# Patient Record
Sex: Male | Born: 1955
Health system: Southern US, Community
[De-identification: ages and names within clinical notes are randomized; demographics above are authoritative.]

## PROBLEM LIST (undated history)

## (undated) DIAGNOSIS — R030 Elevated blood-pressure reading, without diagnosis of hypertension: Secondary | ICD-10-CM

## (undated) DIAGNOSIS — F32A Depression, unspecified: Secondary | ICD-10-CM

## (undated) DIAGNOSIS — E559 Vitamin D deficiency, unspecified: Secondary | ICD-10-CM

## (undated) DIAGNOSIS — Z9109 Other allergy status, other than to drugs and biological substances: Secondary | ICD-10-CM

## (undated) DIAGNOSIS — F419 Anxiety disorder, unspecified: Secondary | ICD-10-CM

## (undated) DIAGNOSIS — I10 Essential (primary) hypertension: Secondary | ICD-10-CM

## (undated) DIAGNOSIS — F329 Major depressive disorder, single episode, unspecified: Secondary | ICD-10-CM

## (undated) DIAGNOSIS — H9190 Unspecified hearing loss, unspecified ear: Secondary | ICD-10-CM

## (undated) DIAGNOSIS — E66811 Obesity, class 1: Secondary | ICD-10-CM

## (undated) DIAGNOSIS — N200 Calculus of kidney: Secondary | ICD-10-CM

## (undated) DIAGNOSIS — K219 Gastro-esophageal reflux disease without esophagitis: Secondary | ICD-10-CM

## (undated) DIAGNOSIS — E7800 Pure hypercholesterolemia, unspecified: Secondary | ICD-10-CM

## (undated) DIAGNOSIS — J189 Pneumonia, unspecified organism: Secondary | ICD-10-CM

## (undated) DIAGNOSIS — E669 Obesity, unspecified: Secondary | ICD-10-CM

## (undated) DIAGNOSIS — J342 Deviated nasal septum: Secondary | ICD-10-CM

## (undated) DIAGNOSIS — K760 Fatty (change of) liver, not elsewhere classified: Secondary | ICD-10-CM

## (undated) DIAGNOSIS — D126 Benign neoplasm of colon, unspecified: Secondary | ICD-10-CM

## (undated) DIAGNOSIS — B029 Zoster without complications: Secondary | ICD-10-CM

## (undated) HISTORY — DX: Zoster without complications: B02.9

## (undated) HISTORY — DX: Essential (primary) hypertension: I10

## (undated) HISTORY — DX: Obesity, unspecified: E66.9

## (undated) HISTORY — DX: Calculus of kidney: N20.0

## (undated) HISTORY — PX: POLYPECTOMY: SHX149

## (undated) HISTORY — DX: Other allergy status, other than to drugs and biological substances: Z91.09

## (undated) HISTORY — DX: Gilbert syndrome: E80.4

## (undated) HISTORY — DX: Benign neoplasm of colon, unspecified: D12.6

## (undated) HISTORY — DX: Obesity, class 1: E66.811

## (undated) HISTORY — DX: Vitamin D deficiency, unspecified: E55.9

## (undated) HISTORY — DX: Fatty (change of) liver, not elsewhere classified: K76.0

## (undated) HISTORY — DX: Unspecified hearing loss, unspecified ear: H91.90

## (undated) HISTORY — DX: Deviated nasal septum: J34.2

## (undated) HISTORY — PX: OTHER SURGICAL HISTORY: SHX169

## (undated) HISTORY — DX: Elevated blood-pressure reading, without diagnosis of hypertension: R03.0

## (undated) HISTORY — DX: Pure hypercholesterolemia, unspecified: E78.00

---
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
## 2005-11-05 ENCOUNTER — Ambulatory Visit (HOSPITAL_COMMUNITY): Admission: RE | Admit: 2005-11-05 | Discharge: 2005-11-05 | Payer: Self-pay | Admitting: Internal Medicine

## 2006-11-05 ENCOUNTER — Ambulatory Visit (HOSPITAL_COMMUNITY): Admission: RE | Admit: 2006-11-05 | Discharge: 2006-11-05 | Payer: Self-pay | Admitting: Internal Medicine

## 2007-05-07 ENCOUNTER — Ambulatory Visit: Payer: Self-pay | Admitting: Internal Medicine

## 2008-08-06 ENCOUNTER — Ambulatory Visit: Payer: Self-pay | Admitting: Internal Medicine

## 2008-08-18 ENCOUNTER — Ambulatory Visit: Payer: Self-pay | Admitting: Internal Medicine

## 2008-08-18 ENCOUNTER — Encounter: Payer: Self-pay | Admitting: Internal Medicine

## 2008-08-19 ENCOUNTER — Encounter: Payer: Self-pay | Admitting: Internal Medicine

## 2009-12-17 DIAGNOSIS — B029 Zoster without complications: Secondary | ICD-10-CM

## 2009-12-17 HISTORY — DX: Zoster without complications: B02.9

## 2011-08-29 ENCOUNTER — Encounter: Payer: Self-pay | Admitting: Internal Medicine

## 2012-01-17 ENCOUNTER — Encounter: Payer: Self-pay | Admitting: Internal Medicine

## 2012-01-21 ENCOUNTER — Encounter: Payer: Self-pay | Admitting: Internal Medicine

## 2012-01-21 ENCOUNTER — Ambulatory Visit (AMBULATORY_SURGERY_CENTER): Payer: BC Managed Care – PPO

## 2012-01-21 VITALS — Ht 69.5 in | Wt 225.4 lb

## 2012-01-21 DIAGNOSIS — Z1211 Encounter for screening for malignant neoplasm of colon: Secondary | ICD-10-CM

## 2012-01-21 MED ORDER — PEG-KCL-NACL-NASULF-NA ASC-C 100 G PO SOLR: 1.0000 | Freq: Once | ORAL | Status: DC

## 2012-02-01 ENCOUNTER — Ambulatory Visit (AMBULATORY_SURGERY_CENTER): Payer: BC Managed Care – PPO | Admitting: Internal Medicine

## 2012-02-01 ENCOUNTER — Encounter: Payer: Self-pay | Admitting: Internal Medicine

## 2012-02-01 VITALS — BP 149/93 | HR 69 | Temp 97.2°F | Resp 12 | Ht 69.0 in | Wt 225.0 lb

## 2012-02-01 DIAGNOSIS — K5730 Diverticulosis of large intestine without perforation or abscess without bleeding: Secondary | ICD-10-CM

## 2012-02-01 DIAGNOSIS — Z1211 Encounter for screening for malignant neoplasm of colon: Secondary | ICD-10-CM

## 2012-02-01 DIAGNOSIS — Z86010 Personal history of colonic polyps: Secondary | ICD-10-CM

## 2012-02-01 HISTORY — PX: COLONOSCOPY: SHX174

## 2012-02-01 MED ORDER — SODIUM CHLORIDE 0.9 % IV SOLN: 500.0000 mL | INTRAVENOUS | Status: DC

## 2012-02-01 NOTE — Op Note (Signed)
[facility] [address] [place], [place]  [address]  COLONOSCOPY PROCEDURE REPORT  PATIENT:  Stephens, Shreve  MR#:  #[id number] BIRTHDATE:  [birth year], 56 yrs. old  GENDER:  male ENDOSCOPIST:  Wilhemina Bonito. Eda Keys, MD REF. BY:  Surveillance Program Recall, PROCEDURE DATE:  02/01/2012 PROCEDURE:  Surveillance Colonoscopy ASA CLASS:  Class II INDICATIONS:  history of pre-cancerous (adenomatous) colon polyps, surveillance and high-risk screening ; index exam 08-2008 > 3 small adenomas MEDICATIONS:   MAC sedation, administered by CRNA, propofol (Diprivan) 250 mg IV  DESCRIPTION OF PROCEDURE:   After the risks benefits and alternatives of the procedure were thoroughly explained, informed consent was obtained.  Digital rectal exam was performed and revealed no abnormalities.   The LB CF-[id number] endoscope was introduced through the anus and advanced to the cecum, which was identified by both the appendix and ileocecal valve, without limitations.  The quality of the prep was good, using MoviPrep. The instrument was then slowly withdrawn as the colon was fully examined. <<PROCEDUREIMAGES>>  FINDINGS:  Scattered diverticula were found.  Otherwise normal colonoscopy without  polyps, masses, vascular ectasias, or inflammatory changes.   Retroflexed views in the rectum revealed no abnormalities.    The time to cecum = 2:20  minutes. The scope was then withdrawn in  10:15  minutes from the cecum and the procedure completed.  COMPLICATIONS:  None  ENDOSCOPIC IMPRESSION: 1) Diverticula, scattered 2) Otherwise normal colonoscopy  RECOMMENDATIONS: 1) Follow up colonoscopy in 5 years  ______________________________ Wilhemina Bonito. Eda Keys, MD  CC:  Rodrigo Ran, MD;  The Patient  n. eSIGNED:   Wilhemina Bonito. Eda Keys at 02/01/2012 08:38 AM  Lovenia Kim, [phone]

## 2012-02-01 NOTE — Progress Notes (Signed)
Patient did not have preoperative order for IV antibiotic SSI prophylaxis. (G8918)  Patient did not experience any of the following events: a burn prior to discharge; a fall within the facility; wrong site/side/patient/procedure/implant event; or a hospital transfer or hospital admission upon discharge from the facility. (G8907)  

## 2012-02-01 NOTE — Patient Instructions (Signed)
Green and blue discharge instructions reviewed with patient and care partner.  Impressions/recommendations:  Diverticulosis (handout given)  Repeat colonoscopy in 5 years.  Please resume medications as you were taking them prior to your procedure.

## 2012-02-04 ENCOUNTER — Telehealth: Payer: Self-pay | Admitting: *Deleted

## 2012-02-04 NOTE — Telephone Encounter (Signed)
  Follow up Call-  Call back number 02/01/2012  Post procedure Call Back phone  # [phone]  Permission to leave phone message Yes     Patient questions:  Do you have a fever, pain , or abdominal swelling? no Pain Score  0 *  Have you tolerated food without any problems? yes  Have you been able to return to your normal activities? yes  Do you have any questions about your discharge instructions: Diet   no Medications  no Follow up visit  no  Do you have questions or concerns about your Care? no  Actions: * If pain score is 4 or above: No action needed, pain <4.

## 2013-06-22 ENCOUNTER — Other Ambulatory Visit: Payer: Self-pay | Admitting: Dermatology

## 2015-12-15 ENCOUNTER — Other Ambulatory Visit: Payer: Self-pay | Admitting: Internal Medicine

## 2015-12-15 DIAGNOSIS — R1011 Right upper quadrant pain: Secondary | ICD-10-CM

## 2015-12-16 ENCOUNTER — Other Ambulatory Visit: Payer: Self-pay | Admitting: Internal Medicine

## 2015-12-16 ENCOUNTER — Ambulatory Visit
Admission: RE | Admit: 2015-12-16 | Discharge: 2015-12-16 | Disposition: A | Discharge status: Home / Self Care | Payer: BLUE CROSS/BLUE SHIELD | Source: Ambulatory Visit | Attending: Internal Medicine | Admitting: Internal Medicine

## 2015-12-16 ENCOUNTER — Other Ambulatory Visit: Payer: Self-pay

## 2015-12-16 DIAGNOSIS — R1011 Right upper quadrant pain: Secondary | ICD-10-CM

## 2015-12-20 ENCOUNTER — Ambulatory Visit: Payer: Self-pay | Admitting: Surgery

## 2015-12-21 ENCOUNTER — Encounter (HOSPITAL_COMMUNITY): Payer: Self-pay | Admitting: Surgery

## 2015-12-21 DIAGNOSIS — K8010 Calculus of gallbladder with chronic cholecystitis without obstruction: Secondary | ICD-10-CM | POA: Diagnosis present

## 2015-12-21 NOTE — Patient Instructions (Addendum)
David Hurley  12/21/2015   Your procedure is scheduled on: 12/23/2015    Report to [facility]  Entrance take [place]  elevators to 3rd floor to  [place] at   [place] AM.  Call this number if you have problems the morning of surgery [phone]   Remember: ONLY 1 PERSON MAY GO WITH YOU TO SHORT STAY TO GET  READY MORNING OF [place].  Do not eat food or drink liquids :After Midnight.     Take these medicines the morning of surgery with A SIP OF WATER:                                 You may not have any metal on your body including hair pins and              piercings  Do not wear jewelry, , lotions, powders or perfumes, deodorant                          Men may shave face and neck.   Do not bring valuables to the hospital. [place].  Contacts, dentures or bridgework may not be worn into surgery.  Leave suitcase in the car. After surgery it may be brought to your room.       Special Instructions: coughing and deep breathing exercises, leg exercises               Please read over the following fact sheets you were given: _____________________________________________________________________             [facility] - Preparing for Surgery Before surgery, you can play an important role.  Because skin is not sterile, your skin needs to be as free of germs as possible.  You can reduce the number of germs on your skin by washing with CHG (chlorahexidine gluconate) soap before surgery.  CHG is an antiseptic cleaner which kills germs and bonds with the skin to continue killing germs even after washing. Please DO NOT use if you have an allergy to CHG or antibacterial soaps.  If your skin becomes reddened/irritated stop using the CHG and inform your nurse when you arrive at Short Stay. Do not shave (including legs and underarms) for at least 48 hours prior to the first CHG shower.  You may shave your  face/neck. Please follow these instructions carefully:  1.  Shower with CHG Soap the night before surgery and the  morning of Surgery.  2.  If you choose to wash your hair, wash your hair first as usual with your  normal  shampoo.  3.  After you shampoo, rinse your hair and body thoroughly to remove the  shampoo.                           4.  Use CHG as you would any other liquid soap.  You can apply chg directly  to the skin and wash                       Gently with a scrungie or clean washcloth.  5.  Apply the  CHG Soap to your body ONLY FROM THE NECK DOWN.   Do not use on face/ open                           Wound or open sores. Avoid contact with eyes, ears mouth and genitals (private parts).                       Wash face,  Genitals (private parts) with your normal soap.             6.  Wash thoroughly, paying special attention to the area where your surgery  will be performed.  7.  Thoroughly rinse your body with warm water from the neck down.  8.  DO NOT shower/wash with your normal soap after using and rinsing off  the CHG Soap.                9.  Pat yourself dry with a clean towel.            10.  Wear clean pajamas.            11.  Place clean sheets on your bed the night of your first shower and do not  sleep with pets. Day of Surgery : Do not apply any lotions/deodorants the morning of surgery.  Please wear clean clothes to the hospital/surgery center.  FAILURE TO FOLLOW THESE INSTRUCTIONS MAY RESULT IN THE CANCELLATION OF YOUR SURGERY PATIENT SIGNATURE_________________________________  NURSE SIGNATURE__________________________________  ________________________________________________________________________

## 2015-12-21 NOTE — H&P (Signed)
General Surgery [facility]  Evern Core [place] DOB: [birth year] Married / Language: English / Race: White Male  History of Present Illness The patient is a 60 year old male who presents for evaluation of gallstones.  Patient is referred by Dr. Crist Infante for evaluation of symptomatic cholelithiasis. Patient has had approximately a one-year history of intermittent right upper quadrant abdominal pain. This often occurs after heavy meals. It only last for a few minutes. He denies nausea or vomiting. He denies fevers or chills. He denies jaundice or acholic stools. He has had previous upper endoscopy by Dr. Scarlette Shorts for gastroesophageal reflux. He takes Mylanta for reflux symptoms. Patient underwent abdominal ultrasound on December 16, 2015. This shows multiple gallstones with the largest stone measuring 8 mm. There was no sign of infection. Patient does have a family history of gallbladder disease in his father and multiple family members.  Other Problems Cholelithiasis Gastroesophageal Reflux Disease  Past Surgical History No pertinent past surgical history  Diagnostic Studies History Colonoscopy 5-10 years ago  Allergies No Known Drug Allergies01/02/2016  Medication Aspirin (81MG  Tablet, Oral) Active. Amlodipine-Atorvastatin (5-20MG  Tablet, Oral) Active. Gabapentin (100MG  Tablet, Oral) Active. Vitamin D (2000UNIT Capsule, Oral) Active. Niacin (100MG  Tablet, Oral) Active. ALPRAZolam (0.5MG  Tablet, Oral) Active. Lexapro (10MG  Tablet, Oral) Active. Crestor (20MG  Tablet, Oral) Active. Lisinopril (10MG  Tablet, Oral) Active. Medications Reconciled  Social History Alcohol use Occasional alcohol use. Caffeine use Carbonated beverages. No drug use Tobacco use Never smoker.  Family History Arthritis Mother. Hypertension Mother. Prostate Cancer Brother.  Review of Systems  General Not Present- Appetite Loss, Chills,  Fatigue, Fever, Night Sweats, Weight Gain and Weight Loss. Skin Not Present- Change in Wart/Mole, Dryness, Hives, Jaundice, New Lesions, Non-Healing Wounds, Rash and Ulcer. HEENT Not Present- Earache, Hearing Loss, Hoarseness, Nose Bleed, Oral Ulcers, Ringing in the Ears, Seasonal Allergies, Sinus Pain, Sore Throat, Visual Disturbances, Wears glasses/contact lenses and Yellow Eyes. Respiratory Not Present- Bloody sputum, Chronic Cough, Difficulty Breathing, Snoring and Wheezing. Breast Not Present- Breast Mass, Breast Pain, Nipple Discharge and Skin Changes. Cardiovascular Not Present- Chest Pain, Difficulty Breathing Lying Down, Leg Cramps, Palpitations, Rapid Heart Rate, Shortness of Breath and Swelling of Extremities. Gastrointestinal Present- Abdominal Pain. Not Present- Bloating, Bloody Stool, Change in Bowel Habits, Chronic diarrhea, Constipation, Difficulty Swallowing, Excessive gas, Gets full quickly at meals, Hemorrhoids, Indigestion, Nausea, Rectal Pain and Vomiting. Musculoskeletal Not Present- Back Pain, Joint Pain, Joint Stiffness, Muscle Pain, Muscle Weakness and Swelling of Extremities. Neurological Not Present- Decreased Memory, Fainting, Headaches, Numbness, Seizures, Tingling, Tremor, Trouble walking and Weakness. Psychiatric Not Present- Anxiety, Bipolar, Change in Sleep Pattern, Depression, Fearful and Frequent crying. Endocrine Not Present- Cold Intolerance, Excessive Hunger, Hair Changes, Heat Intolerance, Hot flashes and New Diabetes. Hematology Not Present- Easy Bruising, Excessive bleeding, Gland problems, HIV and Persistent Infections.  Vitals Weight: 227 lb Height: 66in Body Surface Area: 2.11 m Body Mass Index: 36.64 kg/m  Temp.: 98.54F(Temporal)  Pulse: 80 (Regular)  BP: 128/76 (Sitting, Left Arm, Standard)  Physical Exam  General - appears comfortable, no distress; not diaphorectic  HEENT - normocephalic; sclerae clear, gaze conjugate; mucous  membranes moist, dentition good; voice normal  Neck - symmetric on extension; no palpable anterior or posterior cervical adenopathy; no palpable masses in the thyroid bed  Chest - clear bilaterally without rhonchi, rales, or wheeze  Cor - regular rhythm with normal rate; no significant murmur  Abd - soft without distension; no surgical incisions; no sign of hernia; mild tenderness  to deep palpation right upper quadrant without palpable mass  Ext - non-tender without significant edema or lymphedema  Neuro - grossly intact; no tremor   Assessment & Plan  CHOLELITHIASIS WITH CHRONIC CHOLECYSTITIS (K80.10)  Patient presents with symptomatic cholelithiasis. He is provided with written literature on gallbladder surgery to review at home.  I have recommended proceeding with laparoscopic cholecystectomy with intraoperative cholangiography in the near future. We have discussed the procedure. We have discussed the potential for open surgery. We have discussed the postoperative course to be anticipated. Patient understands and wishes to proceed in the near future.  The risks and benefits of the procedure have been discussed at length with the patient. The patient understands the proposed procedure, potential alternative treatments, and the course of recovery to be expected. All of the patient's questions have been answered at this time. The patient wishes to proceed with surgery.  Earnstine Regal, MD, [facility] Office: [phone]

## 2015-12-22 ENCOUNTER — Encounter (HOSPITAL_COMMUNITY): Payer: Self-pay

## 2015-12-22 ENCOUNTER — Encounter (HOSPITAL_COMMUNITY)
Admission: RE | Admit: 2015-12-22 | Discharge: 2015-12-22 | Disposition: A | Discharge status: Home / Self Care | Payer: BLUE CROSS/BLUE SHIELD | Source: Ambulatory Visit | Attending: Surgery | Admitting: Surgery

## 2015-12-22 HISTORY — DX: Depression, unspecified: F32.A

## 2015-12-22 HISTORY — DX: Pneumonia, unspecified organism: J18.9

## 2015-12-22 HISTORY — DX: Major depressive disorder, single episode, unspecified: F32.9

## 2015-12-22 HISTORY — DX: Anxiety disorder, unspecified: F41.9

## 2015-12-22 HISTORY — DX: Gastro-esophageal reflux disease without esophagitis: K21.9

## 2015-12-22 NOTE — Progress Notes (Signed)
EKG faxed from Dr Crist Infante office.  Not confirmed by MD.  Hulen Skains and left message with assistant of Dr Crist Infante that EKG would need to be confirmed by MD and refaxed to [place].

## 2015-12-22 NOTE — Progress Notes (Signed)
Patient had 1100preop appt at [facility] in [place].  Patient did not show at appointed time for preop appointment.  Called patient and he stated he was at PCP- Dr Joylene Draft office getting EKG which would be no charge for then he would come see [place] afterwards.  Patient stated he had been trying to call us all morning.  I told patient to have Dr Perini's office send confirmed EKG to [place].  Patient voiced understanding.  Patient instructed to call us as soon has he finished at MD office so we could obtain medical history and to give him preop instructions.  Patient voiced understanding.

## 2015-12-23 ENCOUNTER — Encounter (HOSPITAL_COMMUNITY): Payer: Self-pay | Admitting: *Deleted

## 2015-12-23 ENCOUNTER — Encounter (HOSPITAL_COMMUNITY)
Admission: RE | Disposition: A | Discharge status: Home / Self Care | Payer: Self-pay | Source: Ambulatory Visit | Attending: Surgery

## 2015-12-23 ENCOUNTER — Ambulatory Visit (HOSPITAL_COMMUNITY): Payer: BLUE CROSS/BLUE SHIELD

## 2015-12-23 ENCOUNTER — Ambulatory Visit (HOSPITAL_COMMUNITY): Payer: BLUE CROSS/BLUE SHIELD | Admitting: Anesthesiology

## 2015-12-23 ENCOUNTER — Observation Stay (HOSPITAL_COMMUNITY)
Admission: RE | Admit: 2015-12-23 | Discharge: 2015-12-23 | Disposition: A | Discharge status: Home / Self Care | Payer: BLUE CROSS/BLUE SHIELD | Source: Ambulatory Visit | Attending: Surgery | Admitting: Surgery

## 2015-12-23 DIAGNOSIS — F419 Anxiety disorder, unspecified: Secondary | ICD-10-CM | POA: Diagnosis not present

## 2015-12-23 DIAGNOSIS — E669 Obesity, unspecified: Secondary | ICD-10-CM | POA: Diagnosis not present

## 2015-12-23 DIAGNOSIS — R1011 Right upper quadrant pain: Secondary | ICD-10-CM | POA: Diagnosis present

## 2015-12-23 DIAGNOSIS — K8010 Calculus of gallbladder with chronic cholecystitis without obstruction: Principal | ICD-10-CM | POA: Diagnosis present

## 2015-12-23 DIAGNOSIS — K219 Gastro-esophageal reflux disease without esophagitis: Secondary | ICD-10-CM | POA: Diagnosis not present

## 2015-12-23 DIAGNOSIS — Z79899 Other long term (current) drug therapy: Secondary | ICD-10-CM | POA: Insufficient documentation

## 2015-12-23 DIAGNOSIS — I10 Essential (primary) hypertension: Secondary | ICD-10-CM | POA: Diagnosis not present

## 2015-12-23 DIAGNOSIS — K8020 Calculus of gallbladder without cholecystitis without obstruction: Secondary | ICD-10-CM

## 2015-12-23 DIAGNOSIS — Z6836 Body mass index (BMI) 36.0-36.9, adult: Secondary | ICD-10-CM | POA: Insufficient documentation

## 2015-12-23 DIAGNOSIS — Z7982 Long term (current) use of aspirin: Secondary | ICD-10-CM | POA: Diagnosis not present

## 2015-12-23 DIAGNOSIS — F329 Major depressive disorder, single episode, unspecified: Secondary | ICD-10-CM | POA: Diagnosis not present

## 2015-12-23 HISTORY — PX: CHOLECYSTECTOMY: SHX55

## 2015-12-23 SURGERY — LAPAROSCOPIC CHOLECYSTECTOMY WITH INTRAOPERATIVE CHOLANGIOGRAM
Anesthesia: General | Site: Abdomen

## 2015-12-23 MED ORDER — DEXAMETHASONE SODIUM PHOSPHATE 10 MG/ML IJ SOLN
INTRAMUSCULAR | Status: DC | PRN
  Administered 2015-12-23: 10 mg via INTRAVENOUS

## 2015-12-23 MED ORDER — LISINOPRIL 20 MG PO TABS
20.0000 mg | ORAL_TABLET | Freq: Every evening | ORAL | Status: DC
  Filled 2015-12-23: qty 1

## 2015-12-23 MED ORDER — BUPIVACAINE HCL (PF) 0.5 % IJ SOLN
INTRAMUSCULAR | Status: AC
  Filled 2015-12-23: qty 30

## 2015-12-23 MED ORDER — FENTANYL CITRATE (PF) 100 MCG/2ML IJ SOLN
INTRAMUSCULAR | Status: DC | PRN
  Administered 2015-12-23: 50 ug via INTRAVENOUS
  Administered 2015-12-23: 100 ug via INTRAVENOUS
  Administered 2015-12-23: 50 ug via INTRAVENOUS

## 2015-12-23 MED ORDER — ONDANSETRON HCL 4 MG/2ML IJ SOLN: 4.0000 mg | Freq: Once | INTRAMUSCULAR | Status: DC | PRN

## 2015-12-23 MED ORDER — FENTANYL CITRATE (PF) 100 MCG/2ML IJ SOLN
25.0000 ug | INTRAMUSCULAR | Status: DC | PRN
  Administered 2015-12-23 (×2): 50 ug via INTRAVENOUS

## 2015-12-23 MED ORDER — NEOSTIGMINE METHYLSULFATE 10 MG/10ML IV SOLN
INTRAVENOUS | Status: DC | PRN
  Administered 2015-12-23: 4 mg via INTRAVENOUS

## 2015-12-23 MED ORDER — ONDANSETRON HCL 4 MG/2ML IJ SOLN
INTRAMUSCULAR | Status: AC
  Filled 2015-12-23: qty 2

## 2015-12-23 MED ORDER — FENTANYL CITRATE (PF) 250 MCG/5ML IJ SOLN
INTRAMUSCULAR | Status: AC
  Filled 2015-12-23: qty 5

## 2015-12-23 MED ORDER — LIDOCAINE HCL (CARDIAC) 20 MG/ML IV SOLN
INTRAVENOUS | Status: AC
  Filled 2015-12-23: qty 5

## 2015-12-23 MED ORDER — ROCURONIUM BROMIDE 100 MG/10ML IV SOLN
INTRAVENOUS | Status: AC
  Filled 2015-12-23: qty 1

## 2015-12-23 MED ORDER — GABAPENTIN 300 MG PO CAPS
600.0000 mg | ORAL_CAPSULE | Freq: Every day | ORAL | Status: DC
  Filled 2015-12-23: qty 2

## 2015-12-23 MED ORDER — ONDANSETRON 4 MG PO TBDP: 4.0000 mg | ORAL_TABLET | Freq: Four times a day (QID) | ORAL | Status: DC | PRN

## 2015-12-23 MED ORDER — CEFAZOLIN SODIUM-DEXTROSE 2-3 GM-% IV SOLR
2.0000 g | INTRAVENOUS | Status: AC
  Administered 2015-12-23: 2 g via INTRAVENOUS

## 2015-12-23 MED ORDER — PROPOFOL 10 MG/ML IV BOLUS
INTRAVENOUS | Status: AC
  Filled 2015-12-23: qty 20

## 2015-12-23 MED ORDER — ONDANSETRON HCL 4 MG/2ML IJ SOLN
INTRAMUSCULAR | Status: DC | PRN
  Administered 2015-12-23: 4 mg via INTRAVENOUS

## 2015-12-23 MED ORDER — IOHEXOL 300 MG/ML  SOLN
INTRAMUSCULAR | Status: DC | PRN
  Administered 2015-12-23: 14 mL

## 2015-12-23 MED ORDER — HYDROCODONE-ACETAMINOPHEN 5-325 MG PO TABS
1.0000 | ORAL_TABLET | ORAL | Status: DC | PRN
  Administered 2015-12-23: 2 via ORAL
  Filled 2015-12-23: qty 2

## 2015-12-23 MED ORDER — GLYCOPYRROLATE 0.2 MG/ML IJ SOLN
INTRAMUSCULAR | Status: AC
  Filled 2015-12-23: qty 3

## 2015-12-23 MED ORDER — PROPOFOL 10 MG/ML IV BOLUS
INTRAVENOUS | Status: DC | PRN
  Administered 2015-12-23: 150 mg via INTRAVENOUS

## 2015-12-23 MED ORDER — CEFAZOLIN SODIUM-DEXTROSE 2-3 GM-% IV SOLR
INTRAVENOUS | Status: AC
  Filled 2015-12-23: qty 50

## 2015-12-23 MED ORDER — AMLODIPINE BESYLATE 5 MG PO TABS
5.0000 mg | ORAL_TABLET | Freq: Every day | ORAL | Status: DC
  Administered 2015-12-23: 5 mg via ORAL
  Filled 2015-12-23: qty 1

## 2015-12-23 MED ORDER — SUCCINYLCHOLINE CHLORIDE 20 MG/ML IJ SOLN
INTRAMUSCULAR | Status: DC | PRN
  Administered 2015-12-23: 100 mg via INTRAVENOUS

## 2015-12-23 MED ORDER — HYDROMORPHONE HCL 1 MG/ML IJ SOLN: 1.0000 mg | INTRAMUSCULAR | Status: DC | PRN

## 2015-12-23 MED ORDER — HYDROCODONE-ACETAMINOPHEN 5-325 MG PO TABS: 1.0000 | ORAL_TABLET | ORAL | Status: DC | PRN

## 2015-12-23 MED ORDER — ESCITALOPRAM OXALATE 10 MG PO TABS
10.0000 mg | ORAL_TABLET | Freq: Every day | ORAL | Status: DC
  Administered 2015-12-23: 10 mg via ORAL
  Filled 2015-12-23: qty 1

## 2015-12-23 MED ORDER — FENTANYL CITRATE (PF) 100 MCG/2ML IJ SOLN
INTRAMUSCULAR | Status: DC
Start: 2015-12-23 — End: 2015-12-23
  Filled 2015-12-23: qty 2

## 2015-12-23 MED ORDER — MIDAZOLAM HCL 2 MG/2ML IJ SOLN
INTRAMUSCULAR | Status: AC
Start: 2015-12-23 — End: 2015-12-23
  Filled 2015-12-23: qty 2

## 2015-12-23 MED ORDER — BUPIVACAINE HCL 0.5 % IJ SOLN
INTRAMUSCULAR | Status: DC | PRN
  Administered 2015-12-23: 20 mL

## 2015-12-23 MED ORDER — ROCURONIUM BROMIDE 100 MG/10ML IV SOLN
INTRAVENOUS | Status: DC | PRN
  Administered 2015-12-23: 40 mg via INTRAVENOUS

## 2015-12-23 MED ORDER — LIDOCAINE HCL (PF) 2 % IJ SOLN
INTRAMUSCULAR | Status: DC | PRN
  Administered 2015-12-23: 30 mg via INTRADERMAL

## 2015-12-23 MED ORDER — ALPRAZOLAM 1 MG PO TABS: 1.0000 mg | ORAL_TABLET | Freq: Every day | ORAL | Status: DC | PRN

## 2015-12-23 MED ORDER — ACETAMINOPHEN 325 MG PO TABS
650.0000 mg | ORAL_TABLET | Freq: Four times a day (QID) | ORAL | Status: DC | PRN
Start: 2015-12-23 — End: 2015-12-23

## 2015-12-23 MED ORDER — ACETAMINOPHEN 650 MG RE SUPP: 650.0000 mg | Freq: Four times a day (QID) | RECTAL | Status: DC | PRN

## 2015-12-23 MED ORDER — MIDAZOLAM HCL 5 MG/5ML IJ SOLN
INTRAMUSCULAR | Status: DC | PRN
  Administered 2015-12-23: 2 mg via INTRAVENOUS

## 2015-12-23 MED ORDER — KCL IN DEXTROSE-NACL 20-5-0.45 MEQ/L-%-% IV SOLN
INTRAVENOUS | Status: DC
  Administered 2015-12-23: 11:00:00 via INTRAVENOUS
  Filled 2015-12-23: qty 1000

## 2015-12-23 MED ORDER — DEXAMETHASONE SODIUM PHOSPHATE 10 MG/ML IJ SOLN
INTRAMUSCULAR | Status: AC
Start: 2015-12-23 — End: 2015-12-23
  Filled 2015-12-23: qty 1

## 2015-12-23 MED ORDER — LACTATED RINGERS IV SOLN: INTRAVENOUS | Status: DC

## 2015-12-23 MED ORDER — GLYCOPYRROLATE 0.2 MG/ML IJ SOLN
INTRAMUSCULAR | Status: DC | PRN
  Administered 2015-12-23: 0.6 mg via INTRAVENOUS

## 2015-12-23 MED ORDER — LACTATED RINGERS IR SOLN
Status: DC | PRN
  Administered 2015-12-23: 1000 mL

## 2015-12-23 MED ORDER — ONDANSETRON HCL 4 MG/2ML IJ SOLN: 4.0000 mg | Freq: Four times a day (QID) | INTRAMUSCULAR | Status: DC | PRN

## 2015-12-23 MED ORDER — LACTATED RINGERS IV SOLN
INTRAVENOUS | Status: DC | PRN
  Administered 2015-12-23: 07:00:00 via INTRAVENOUS

## 2015-12-23 SURGICAL SUPPLY — 37 items
APL SKNCLS STERI-STRIP NONHPOA (GAUZE/BANDAGES/DRESSINGS) ×1
APPLIER CLIP ROT 10 11.4 M/L (STAPLE) ×2
APR CLP MED LRG 11.4X10 (STAPLE) ×1
BAG SPEC RTRVL LRG 6X4 10 (ENDOMECHANICALS) ×1
BENZOIN TINCTURE PRP APPL 2/3 (GAUZE/BANDAGES/DRESSINGS) ×2 IMPLANT
CABLE HIGH FREQUENCY MONO STRZ (ELECTRODE) ×2 IMPLANT
CHLORAPREP W/TINT 26ML (MISCELLANEOUS) ×2 IMPLANT
CLIP APPLIE ROT 10 11.4 M/L (STAPLE) ×1 IMPLANT
COVER MAYO STAND STRL (DRAPES) ×2 IMPLANT
COVER SURGICAL LIGHT HANDLE (MISCELLANEOUS) ×2 IMPLANT
DECANTER SPIKE VIAL GLASS SM (MISCELLANEOUS) ×2 IMPLANT
DRAPE C-ARM 42X120 X-RAY (DRAPES) ×2 IMPLANT
DRAPE LAPAROSCOPIC ABDOMINAL (DRAPES) ×2 IMPLANT
ELECT REM PT RETURN 9FT ADLT (ELECTROSURGICAL) ×2
ELECTRODE REM PT RTRN 9FT ADLT (ELECTROSURGICAL) ×1 IMPLANT
GAUZE SPONGE 2X2 8PLY STRL LF (GAUZE/BANDAGES/DRESSINGS) ×1 IMPLANT
GLOVE BIO SURGEON STRL SZ7.5 (GLOVE) ×1 IMPLANT
GLOVE BIOGEL PI IND STRL 7.5 (GLOVE) IMPLANT
GLOVE BIOGEL PI INDICATOR 7.5 (GLOVE) ×3
GLOVE SURG ORTHO 8.0 STRL STRW (GLOVE) ×2 IMPLANT
GOWN STRL REUS W/TWL XL LVL3 (GOWN DISPOSABLE) ×5 IMPLANT
HEMOSTAT SURGICEL 4X8 (HEMOSTASIS) IMPLANT
KIT BASIN OR (CUSTOM PROCEDURE TRAY) ×2 IMPLANT
POUCH SPECIMEN RETRIEVAL 10MM (ENDOMECHANICALS) ×2 IMPLANT
SCISSORS LAP 5X35 DISP (ENDOMECHANICALS) ×2 IMPLANT
SET CHOLANGIOGRAPH MIX (MISCELLANEOUS) ×2 IMPLANT
SET IRRIG TUBING LAPAROSCOPIC (IRRIGATION / IRRIGATOR) ×2 IMPLANT
SLEEVE XCEL OPT CAN 5 100 (ENDOMECHANICALS) ×2 IMPLANT
SPONGE GAUZE 2X2 STER 10/PKG (GAUZE/BANDAGES/DRESSINGS) ×1
STRIP CLOSURE SKIN 1/2X4 (GAUZE/BANDAGES/DRESSINGS) ×2 IMPLANT
SUT MNCRL AB 4-0 PS2 18 (SUTURE) ×2 IMPLANT
TAPE CLOTH SURG 4X10 WHT LF (GAUZE/BANDAGES/DRESSINGS) ×1 IMPLANT
TOWEL OR 17X26 10 PK STRL BLUE (TOWEL DISPOSABLE) ×2 IMPLANT
TRAY LAPAROSCOPIC (CUSTOM PROCEDURE TRAY) ×2 IMPLANT
TROCAR BLADELESS OPT 5 100 (ENDOMECHANICALS) ×2 IMPLANT
TROCAR XCEL BLUNT TIP 100MML (ENDOMECHANICALS) ×2 IMPLANT
TROCAR XCEL NON-BLD 11X100MML (ENDOMECHANICALS) ×2 IMPLANT

## 2015-12-23 NOTE — Discharge Instructions (Signed)
°  [facility] ° °LAPAROSCOPIC SURGERY:  POST-OP INSTRUCTIONS ° °Always review your discharge instruction sheet given to you by the facility where your surgery was performed. ° °A prescription for pain medication may be given to you upon discharge.  Take your pain medication as prescribed.  If narcotic pain medicine is not needed, then you may take acetaminophen (Tylenol) or ibuprofen (Advil) as needed. ° °Take your usually prescribed medications unless otherwise directed. ° °If you need a refill on your pain medication, please contact your pharmacy.  They will contact our office to request authorization. Prescriptions will not be filled after 5 P.M. or on weekends. ° °You should follow a light diet the first few days after arrival home, such as soup and crackers or toast.  Be sure to include plenty of fluids daily. ° °Most patients will experience some swelling and bruising in the area of the incisions.  Ice packs will help.  Swelling and bruising can take several days to resolve.  ° °It is common to experience some constipation after surgery.  Increasing fluid intake and taking a stool softener (such as Colace) will usually help or prevent this problem from occurring.  A mild laxative (Milk of Magnesia or Miralax) should be taken according to package instructions if there has been no bowel movement after 48 hours. ° °You will have steri-strips and a gauze dressing over your incisions.  You may remove the gauze bandage on the second day after surgery, and you may shower at that time.  Leave your steri-strips (small skin tapes) in place directly over the incision.  These strips should remain on the skin for 5-7 days and then be removed.  You may get them wet in the shower and pat them dry. ° °Any sutures or staples will be removed at the office during your follow-up visit. ° °ACTIVITIES:  You may resume regular (light) daily activities beginning the next day - such as daily self-care, walking,  climbing stairs - gradually increasing activities as tolerated.  You may have sexual intercourse when it is comfortable.  Refrain from any heavy lifting or straining until approved by your doctor. ° °You may drive when you are no longer taking prescription pain medication, you can comfortably wear a seatbelt, and you can safely maneuver your car and apply brakes. ° °You should see your doctor in the office for a follow-up appointment approximately 2-3 weeks after your surgery.  Make sure that you call for this appointment within a day or two after you arrive home to insure a convenient appointment time. ° °WHEN TO CALL YOUR DOCTOR: °1. Fever over 101.0 °2. Inability to urinate °3. Continued bleeding from incision °4. Increased pain, redness, or drainage from the incision °5. Increasing abdominal pain ° °The clinic staff is available to answer your questions during regular business hours.  Please don’t hesitate to call and ask to speak to one of the nurses for clinical concerns.  If you have a medical emergency, go to the nearest emergency room or call 911.  A surgeon from [facility] is always on call for the hospital. ° °Charliene Inoue M. Naia Ruff, MD, FACS °[facility] °Office: [phone] °Toll Free:  [phone] °FAX [phone] ° °Website: [email or web address] °

## 2015-12-23 NOTE — Progress Notes (Signed)
Discharge instructions and prescription given to patient.  Questions answered 

## 2015-12-23 NOTE — Op Note (Signed)
Procedure Note  Pre-operative Diagnosis:  Cholelithiasis, chronic cholecystitis  Post-operative Diagnosis:  same  Surgeon:  Earnstine Regal, MD, FACS  Assistant:  none   Procedure:  Laparoscopic cholecystectomy with intra-operative cholangiography  Anesthesia:  General  Estimated Blood Loss:  minimal  Drains: none         Specimen: Gallbladder to pathology  Indications:  Patient is referred by Dr. Crist Infante for evaluation of symptomatic cholelithiasis. Patient has had approximately a one-year history of intermittent right upper quadrant abdominal pain. This often occurs after heavy meals. It only last for a few minutes. He denies nausea or vomiting. He denies fevers or chills. He denies jaundice or acholic stools. He has had previous upper endoscopy by Dr. Scarlette Shorts for gastroesophageal reflux. He takes Mylanta for reflux symptoms. Patient underwent abdominal ultrasound on December 16, 2015. This shows multiple gallstones with the largest stone measuring 8 mm. There was no sign of infection. Patient does have a family history of gallbladder disease in his father and multiple family members.  Procedure Details:  The patient was seen in the pre-op holding area. The risks, benefits, complications, treatment options, and expected outcomes were previously discussed with the patient. The patient agreed with the proposed plan and has signed the informed consent form.  The patient was brought to the Operating Room, identified as David Hurley and the procedure verified as laparoscopic cholecystectomy with intraoperative cholangiography. A "time out" was completed and the above information confirmed.  The patient was placed in the supine position. Following induction of general anesthesia, the abdomen was prepped and draped in the usual aseptic fashion.  An incision was made in the skin near the umbilicus. The midline fascia was incised and the peritoneal cavity was entered and a  Hasson canula was introduced under direct vision.  The Hasson canula was secured with a 0-Vicryl pursestring suture. Pneumoperitoneum was established with carbon dioxide. Additional trocars were introduced under direct vision along the right costal margin in the midline, mid-clavicular line, and anterior axillary line.   The gallbladder was identified and the fundus grasped and retracted cephalad. Adhesions were taken down bluntly and the electrocautery was utilized as needed, taking care not to injure any adjacent structures. The infundibulum was grasped and retracted laterally, exposing the peritoneum overlying the triangle of Calot. The peritoneum was incised and structures exposed with blunt dissection. The cystic duct was clearly identified, bluntly dissected circumferentially, and clipped at the neck of the gallbladder.  An incision was made in the cystic duct and the cholangiogram catheter introduced. The catheter was secured using an ligaclip.  Real-time cholangiography was performed using C-arm fluoroscopy.  There was rapid filling of a normal caliber common bile duct.  There was reflux of contrast into the left and right hepatic ductal systems.  There was free flow distally into the duodenum without filling defect or obstruction.  The catheter was removed from the peritoneal cavity.  The cystic duct was then ligated with surgical clips and divided. The cystic artery was identified, dissected circumferentially, ligated with ligaclips, and divided.  The gallbladder was dissected away from the liver bed using the electrocautery for hemostasis. The gallbladder was completely removed from the liver and placed into an endocatch bag. The gallbladder was removed in the endocatch bag through the umbilical port site and submitted to pathology for review.  The right upper quadrant was irrigated and the gallbladder bed was inspected. Hemostasis was achieved with the electrocautery.  Pneumoperitoneum was  released after  viewing removal of the trocars with good hemostasis noted. The umbilical wound was irrigated and the fascia was then closed with the pursestring suture.  Local anesthetic was infiltrated at all port sites. The skin incisions were closed with 4-0 Monocril subcuticular sutures and steri-strips and dressings were applied.  Instrument, sponge, and needle counts were correct at the conclusion of the case.  The patient was awakened from anesthesia and brought to the recovery room in stable condition.  The patient tolerated the procedure well.   Earnstine Regal, MD, [facility] Office: [phone]

## 2015-12-23 NOTE — Interval H&P Note (Signed)
History and Physical Interval Note:  12/23/2015 7:09 AM  David Hurley  has presented today for surgery, with the diagnosis of cholelithiasis, chronic cholecystitis.  The various methods of treatment have been discussed with the patient and family. After consideration of risks, benefits and other options for treatment, the patient has consented to    Procedure(s): LAPAROSCOPIC CHOLECYSTECTOMY WITH INTRAOPERATIVE CHOLANGIOGRAM (N/A) as a surgical intervention .    The patient's history has been reviewed, patient examined, no change in status, stable for surgery.  I have reviewed the patient's chart and labs.  Questions were answered to the patient's satisfaction.    Earnstine Regal, MD, [facility] Office: [place]

## 2015-12-23 NOTE — Transfer of Care (Signed)
Immediate Anesthesia Transfer of Care Note  Patient: David Hurley  Procedure(s) Performed: Procedure(s): LAPAROSCOPIC CHOLECYSTECTOMY WITH INTRAOPERATIVE CHOLANGIOGRAM (N/A)  Patient Location: PACU  Anesthesia Type:General  Level of Consciousness:  sedated, patient cooperative and responds to stimulation  Airway & Oxygen Therapy:Patient Spontanous Breathing and Patient connected to face mask oxgen  Post-op Assessment:  Report given to PACU RN and Post -op Vital signs reviewed and stable  Post vital signs:  Reviewed and stable  Last Vitals:  Filed Vitals:   12/23/15 0553  BP: 127/84  Pulse: 66  Temp: 36.7 C  Resp: 18    Complications: No apparent anesthesia complications

## 2015-12-23 NOTE — Anesthesia Preprocedure Evaluation (Addendum)
Anesthesia Evaluation  Patient identified by MRN, date of birth, ID band Patient awake    Reviewed: Allergy & Precautions, NPO status , Patient's Chart, lab work & pertinent test results  History of Anesthesia Complications Negative for: history of anesthetic complications  Airway Mallampati: II  TM Distance: >3 FB Neck ROM: Full    Dental no notable dental hx. (+) Dental Advisory Given   Pulmonary neg pulmonary ROS,    Pulmonary exam normal breath sounds clear to auscultation       Cardiovascular hypertension, Pt. on medications Normal cardiovascular exam Rhythm:Regular Rate:Normal     Neuro/Psych PSYCHIATRIC DISORDERS Anxiety Depression negative neurological ROS     GI/Hepatic Neg liver ROS, GERD  Medicated and Controlled,  Endo/Other  obesity  Renal/GU negative Renal ROS  negative genitourinary   Musculoskeletal negative musculoskeletal ROS (+)   Abdominal   Peds negative pediatric ROS (+)  Hematology negative hematology ROS (+)   Anesthesia Other Findings   Reproductive/Obstetrics negative OB ROS                            Anesthesia Physical Anesthesia Plan  ASA: II  Anesthesia Plan: General   Post-op Pain Management:    Induction: Intravenous  Airway Management Planned: Oral ETT  Additional Equipment:   Intra-op Plan:   Post-operative Plan: Extubation in OR  Informed Consent: I have reviewed the patients History and Physical, chart, labs and discussed the procedure including the risks, benefits and alternatives for the proposed anesthesia with the patient or authorized representative who has indicated his/her understanding and acceptance.   Dental advisory given  Plan Discussed with: CRNA  Anesthesia Plan Comments:         Anesthesia Quick Evaluation

## 2015-12-23 NOTE — Anesthesia Postprocedure Evaluation (Signed)
Anesthesia Post Note  Patient: David Hurley  Procedure(s) Performed: Procedure(s) (LRB): LAPAROSCOPIC CHOLECYSTECTOMY WITH INTRAOPERATIVE CHOLANGIOGRAM (N/A)  Patient location during evaluation: PACU Anesthesia Type: General Level of consciousness: awake and alert Pain management: pain level controlled Vital Signs Assessment: post-procedure vital signs reviewed and stable Respiratory status: spontaneous breathing, nonlabored ventilation, respiratory function stable and patient connected to nasal cannula oxygen Cardiovascular status: blood pressure returned to baseline and stable Postop Assessment: no signs of nausea or vomiting Anesthetic complications: no    Last Vitals:  Filed Vitals:   12/23/15 0930 12/23/15 0945  BP:  154/91  Pulse: 69 70  Temp: 36.9 C 36.5 C  Resp: 14 16    Last Pain:  Filed Vitals:   12/23/15 0948  PainSc: 3                  [name] JENNETTE

## 2015-12-23 NOTE — Discharge Summary (Signed)
Physician Discharge Summary [facility]  Patient ID: David Hurley MRN: [id number] DOB/AGE: 1956/04/23 60 y.o.  Admit date: 12/23/2015 Discharge date: 12/23/2015  Admission Diagnoses:  Cholelithiasis, chronic cholecystitis   Discharge Diagnoses:  Principal Problem:   Cholelithiasis with chronic cholecystitis   Discharged Condition: good  Hospital Course: patient admitted after lap chole for observation.  Tolerated diet.  Ambulated.  Voided.  Pain controlled.  Prepared for discharge home.  Consults: None  Treatments: surgery: lap chole with IOC  Discharge Exam: Blood pressure 149/84, pulse 98, temperature 97.5 F (36.4 C), temperature source Oral, resp. rate 16, height 5\' 6"  (1.676 m), weight 103.647 kg (228 lb 8 oz), SpO2 94 %.  Disposition: Home  Discharge Instructions    Diet - low sodium heart healthy    Complete by:  As directed      Discharge instructions    Complete by:  As directed   [facility]  LAPAROSCOPIC SURGERY:  POST-OP INSTRUCTIONS  Always review your discharge instruction sheet given to you by the facility where your surgery was performed.  A prescription for pain medication may be given to you upon discharge.  Take your pain medication as prescribed.  If narcotic pain medicine is not needed, then you may take acetaminophen (Tylenol) or ibuprofen (Advil) as needed.  Take your usually prescribed medications unless otherwise directed.  If you need a refill on your pain medication, please contact your pharmacy.  They will contact our office to request authorization. Prescriptions will not be filled after 5 P.M. or on weekends.  You should follow a light diet the first few days after arrival home, such as soup and crackers or toast.  Be sure to include plenty of fluids daily.  Most patients will experience some swelling and bruising in the area of the incisions.  Ice packs will help.  Swelling and bruising can take  several days to resolve.   It is common to experience some constipation after surgery.  Increasing fluid intake and taking a stool softener (such as Colace) will usually help or prevent this problem from occurring.  A mild laxative (Milk of Magnesia or Miralax) should be taken according to package instructions if there has been no bowel movement after 48 hours.  You will have steri-strips and a gauze dressing over your incisions.  You may remove the gauze bandage on the second day after surgery, and you may shower at that time.  Leave your steri-strips (small skin tapes) in place directly over the incision.  These strips should remain on the skin for 5-7 days and then be removed.  You may get them wet in the shower and pat them dry.  Any sutures or staples will be removed at the office during your follow-up visit.  ACTIVITIES:  You may resume regular (light) daily activities beginning the next day - such as daily self-care, walking, climbing stairs - gradually increasing activities as tolerated.  You may have sexual intercourse when it is comfortable.  Refrain from any heavy lifting or straining until approved by your doctor.  You may drive when you are no longer taking prescription pain medication, you can comfortably wear a seatbelt, and you can safely maneuver your car and apply brakes.  You should see your doctor in the office for a follow-up appointment approximately 2-3 weeks after your surgery.  Make sure that you call for this appointment within a day or two after you arrive home to insure a convenient  appointment time.  WHEN TO CALL YOUR DOCTOR: Fever over 101.0 Inability to urinate Continued bleeding from incision Increased pain, redness, or drainage from the incision Increasing abdominal pain  The clinic staff is available to answer your questions during regular business hours.  Please don't hesitate to call and ask to speak to one of the nurses for clinical concerns.  If you have a  medical emergency, go to the nearest emergency room or call 911.  A surgeon from [facility] is always on call for the hospital.  Earnstine Regal, MD, [facility] Office: [place] Free:  [place] [phone]  Website: [email or web address]     Increase activity slowly    Complete by:  As directed      Remove dressing in 48 hours    Complete by:  As directed             Medication List    TAKE these medications        ALPRAZolam 1 MG tablet  Commonly known as:  XANAX  Take 1 mg by mouth daily as needed for anxiety or sleep.     amLODipine 5 MG tablet  Commonly known as:  NORVASC  Take 5 mg by mouth daily. Patient takes in the pm     aspirin EC 81 MG tablet  Take 81 mg by mouth every evening.     escitalopram 10 MG tablet  Commonly known as:  LEXAPRO  Take 10 mg by mouth daily. Patient takes in the pm     gabapentin 300 MG capsule  Commonly known as:  NEURONTIN  Take 600 mg by mouth at bedtime.     HYDROcodone-acetaminophen 5-325 MG tablet  Commonly known as:  NORCO/VICODIN  Take 1-2 tablets by mouth every 4 (four) hours as needed for moderate pain.     lisinopril 20 MG tablet  Commonly known as:  PRINIVIL,ZESTRIL  Take 20 mg by mouth every evening.     niacin 500 MG tablet  Commonly known as:  SLO-NIACIN  Take 1,000 mg by mouth at bedtime.     rosuvastatin 40 MG tablet  Commonly known as:  CRESTOR  Take 40 mg by mouth every evening.     Vitamin D3 3000 units Tabs  Take 3,000 Units by mouth every evening.           Follow-up Information    Follow up with Earnstine Regal, MD. Schedule an appointment as soon as possible for a visit in 3 weeks.   Specialty:  General Surgery   Why:  For wound re-check   Contact information:   [place] [address] [phone]       Earnstine Regal, MD, [facility] Office:  [phone]   Signed: Earnstine Regal 12/23/2015, 3:11 PM

## 2015-12-23 NOTE — Anesthesia Procedure Notes (Signed)
Procedure Name: Intubation Date/Time: 12/23/2015 7:28 AM Performed by: Lajuana Carry E Pre-anesthesia Checklist: Patient identified, Emergency Drugs available, Suction available and Patient being monitored Patient Re-evaluated:Patient Re-evaluated prior to inductionOxygen Delivery Method: Circle System Utilized Preoxygenation: Pre-oxygenation with 100% oxygen Intubation Type: IV induction Ventilation: Mask ventilation without difficulty Laryngoscope Size: Miller and 2 Grade View: Grade I Tube type: Oral Tube size: 7.5 mm Number of attempts: 1 Airway Equipment and Method: Stylet Placement Confirmation: ETT inserted through vocal cords under direct vision,  positive ETCO2 and breath sounds checked- equal and bilateral Secured at: 21 cm Tube secured with: Tape Dental Injury: Teeth and Oropharynx as per pre-operative assessment

## 2015-12-26 ENCOUNTER — Encounter (HOSPITAL_COMMUNITY): Payer: Self-pay | Admitting: Surgery

## 2016-08-13 ENCOUNTER — Telehealth: Payer: Self-pay | Admitting: Cardiovascular Disease

## 2016-08-13 NOTE — Telephone Encounter (Signed)
Received records from [facility] for appointment with Dr Claiborne Billings on 10/12/16.  Records given to [facility] (medical records) for Dr Evette Georges schedule on 10/12/16. lp

## 2016-10-12 ENCOUNTER — Encounter: Payer: Self-pay | Admitting: Cardiovascular Disease

## 2016-10-12 ENCOUNTER — Ambulatory Visit (INDEPENDENT_AMBULATORY_CARE_PROVIDER_SITE_OTHER): Payer: BLUE CROSS/BLUE SHIELD | Admitting: Cardiovascular Disease

## 2016-10-12 VITALS — BP 116/74 | HR 65 | Ht 70.0 in | Wt 239.0 lb

## 2016-10-12 DIAGNOSIS — Z79899 Other long term (current) drug therapy: Secondary | ICD-10-CM | POA: Diagnosis not present

## 2016-10-12 DIAGNOSIS — I10 Essential (primary) hypertension: Secondary | ICD-10-CM

## 2016-10-12 DIAGNOSIS — E782 Mixed hyperlipidemia: Secondary | ICD-10-CM

## 2016-10-12 DIAGNOSIS — E669 Obesity, unspecified: Secondary | ICD-10-CM

## 2016-10-12 DIAGNOSIS — Z8719 Personal history of other diseases of the digestive system: Secondary | ICD-10-CM

## 2016-10-12 DIAGNOSIS — Z8249 Family history of ischemic heart disease and other diseases of the circulatory system: Secondary | ICD-10-CM

## 2016-10-12 NOTE — Patient Instructions (Addendum)
Your physician has requested that you have an echocardiogram. Echocardiography is a painless test that uses sound waves to create images of your heart. It provides your doctor with information about the size and shape of your heart and how well your heart's chambers and valves are working. This procedure takes approximately one hour. There are no restrictions for this procedure.  This will be done at the [facility].  Your physician recommends that you schedule a follow-up appointment in: 6 months or sooner if needed.

## 2016-10-14 NOTE — Progress Notes (Signed)
Cardiology Office Note    Date:  10/14/2016   ID:  David Hurley, DOB 1956-03-19, MRN [id number]  PCP:  Jerlyn Ly, MD  Cardiologist:  Shelva Majestic, MD   Chief Complaint  Patient presents with  . New Evaluation    PT TO ESTABLISH CARE FOR MULTIPLE RISK FACTORS OF CAD    History of Present Illness:  David Hurley is a 60 y.o. male who has a cardiac risk factor profile notable for hypertension for at least 10 years, hyperlipidemia, and family history for heart disease.  He is followed by Dr. Haynes Kerns for his primary medical care.  He has been treated with lisinopril 20 mg and amlodipine 5 mg for hypertension.  He is on combination therapy with Crestor 40 mg and niacin 1000 mg for mixed hyperlipidemia.  He has a history of left thigh numbness for which she has taken gabapentin.  He has a history of GERD.  He has also taken Lexapro stress and finds that this has helped him sleep.  His personal friends with Dr. Concepcion Elk.  In January 2017.  He developed right upper quadrant discomfort and underwent laparoscopic cholecystectomy by Dr. Armandina Gemma.  To this well from a cardiovascular standpoint.  He denies any episodes of chest pain.  He does not routinely exercise.  He denies any exertional dyspnea.  Because of his cardiac risk factors, he presents to the office today to establish cardiology care with me.   Past Medical History:  Diagnosis Date  . Anxiety   . Borderline systolic HTN   . Colonic adenoma   . Decreased hearing   . Depression   . Deviated septum   . Environmental allergies   . Fatty liver   . GERD (gastroesophageal reflux disease)   . Gilbert's syndrome   . Hypercholesterolemia   . Hypertension   . Kidney stone   . Obesity (BMI 30.0-34.9)   . Pneumonia    hx of   . Shingles 2011  . Vitamin D insufficiency     Past Surgical History:  Procedure Laterality Date  . broken leg  1970   teenager  right  . callused vocal cord scraping     Dr Remer Macho ENT  on [place] N/A 12/23/2015   Procedure: LAPAROSCOPIC CHOLECYSTECTOMY WITH INTRAOPERATIVE CHOLANGIOGRAM;  Surgeon: Armandina Gemma, MD;  Location: [facility];  Service: General;  Laterality: N/A;  . tooth implant      Current Medications: Outpatient Medications Prior to Visit  Medication Sig Dispense Refill  . ALPRAZolam (XANAX) 1 MG tablet Take 1 mg by mouth daily as needed for anxiety or sleep.   0  . amLODipine (NORVASC) 5 MG tablet Take 5 mg by mouth daily. Patient takes in the pm    . aspirin EC 81 MG tablet Take 81 mg by mouth every evening.    . Cholecalciferol (VITAMIN D3) 3000 units TABS Take 3,000 Units by mouth every evening.    . gabapentin (NEURONTIN) 300 MG capsule Take 600 mg by mouth at bedtime.     Marland Kitchen lisinopril (PRINIVIL,ZESTRIL) 20 MG tablet Take 20 mg by mouth every evening.    . niacin (SLO-NIACIN) 500 MG tablet Take 1,000 mg by mouth at bedtime.    . rosuvastatin (CRESTOR) 40 MG tablet Take 40 mg by mouth every evening.  3  . escitalopram (LEXAPRO) 10 MG tablet Take 10 mg by mouth daily. Patient takes in the pm  1  . HYDROcodone-acetaminophen (  NORCO/VICODIN) 5-325 MG tablet Take 1-2 tablets by mouth every 4 (four) hours as needed for moderate pain. (Patient not taking: Reported on 10/12/2016) 20 tablet 0   No facility-administered medications prior to visit.      Allergies:   Review of patient's allergies indicates no known allergies.   Social History   Social History  . Marital status: Married    Spouse name: N/A  . Number of children: N/A  . Years of education: N/A   Social History Main Topics  . Smoking status: Never Smoker  . Smokeless tobacco: Never Used  . Alcohol use 0.6 oz/week    1 Glasses of wine per week     Comment: 1-2 times per month   . Drug use: No  . Sexual activity: Not Asked   Other Topics Concern  . None   Social History Narrative  . None   Additional social history is notable in that his first marriage lasted 16 years.   He is in his second marriage of 68 years.  He is self-employed in [place].  [employer].  He initially started college at least endocrine and ultimately got his degree from [employer] [place] [employer].  There is no history of tobacco use.  He drinks approximately a glass of wine per week.  Family History:  The patient's family history includes Bipolar disorder in his son; Diabetes in his father; Heart disease in his mother; Hypertension in his mother; Prostate cancer in his brother.   His mother died at age 3 and had "heart issues."  His father is alive at 4 and has diabetes He has 2 children, ages 72 and 63.  ROS General: Positive for obesity;  No fevers, chills, or night sweats;  HEENT: Negative; No changes in vision or hearing, sinus congestion, difficulty swallowing Pulmonary: Negative; No cough, wheezing, shortness of breath, hemoptysis Cardiovascular: Negative; No chest pain, presyncope, syncope, palpitations GI: Positive for gallbladder disease, status post cholecystectomy. GU: Decreased libido;  No dysuria, hematuria, or difficulty voiding Musculoskeletal: Negative; no myalgias, joint pain, or weakness Hematologic/Oncology: Negative; no easy bruising, bleeding Endocrine: Negative; no heat/cold intolerance; no diabetes Neuro: Negative; no changes in balance, headaches Skin: Negative; No rashes or skin lesions Psychiatric: Negative; No behavioral problems, depression Sleep: Negative; No snoring, daytime sleepiness, hypersomnolence, bruxism, restless legs, hypnogognic hallucinations, no cataplexy Other comprehensive 14 point system review is negative.   PHYSICAL EXAM:   VS:  BP 116/74 (BP Location: Right Arm, Patient Position: Sitting, Cuff Size: Normal)   Pulse 65   Ht 5\' 10"  (1.778 m)   Wt 239 lb (108.4 kg)   SpO2 96%   BMI 34.29 kg/m     Repeat blood pressure by me was 128/84.  Wt Readings from Last 3 Encounters:  10/12/16 239 lb (108.4 kg)  12/23/15 228 lb 8 oz (103.6 kg)    02/01/12 225 lb (102.1 kg)    General: Alert, oriented, no distress.  Skin: normal turgor, no rashes, warm and dry HEENT: Normocephalic, atraumatic. Pupils equal round and reactive to light; sclera anicteric; extraocular muscles intact; Fundi without hemorrhages or exudates.  Disks flat Nose without nasal septal hypertrophy Mouth/Parynx benign; Mallinpatti scale 3 Neck: No JVD, no carotid bruits; normal carotid upstroke Lungs: clear to ausculatation and percussion; no wheezing or rales Chest wall: without tenderness to palpitation Heart: PMI not displaced, RRR, s1 s2 normal,  1/6 systolic murmur, no diastolic murmur, no rubs, gallops, thrills, or heaves Abdomen: soft, nontender; no hepatosplenomehaly, BS+; abdominal aorta nontender and not  dilated by palpation. Back: no CVA tenderness Pulses 2+ Musculoskeletal: full range of motion, normal strength, no joint deformities Extremities: no clubbing cyanosis or edema, Homan's sign negative  Neurologic: grossly nonfocal; Cranial nerves grossly wnl Psychologic: Normal mood and affect   Studies/Labs Reviewed:   ECG (independently read by me): Normal sinus rhythm at 65 bpm.  PR interval 146 ms and QTc interval 411 milliseconds.  No significant ST segment changes.  Recent Labs:  No flowsheet data found.   No flowsheet data found.  No flowsheet data found. No results found for: MCV No results found for: TSH No results found for: HGBA1C   BNP No results found for: BNP  ProBNP No results found for: PROBNP   Lipid Panel  No results found for: CHOL, TRIG, HDL, CHOLHDL, VLDL, LDLCALC, LDLDIRECT   RADIOLOGY: No results found.   Additional studies/ records that were reviewed today include:  I personally reviewed the office records from Dr. Elta Guadeloupe Perrini     ASSESSMENT:    1. Essential hypertension, benign   2. Drug therapy   3. Mixed hyperlipidemia   4. Mild obesity   5. History of gallbladder disease   6. Family  history of heart disease      PLAN:  Mr. Rodin Pullium is a 60 year old Caucasian male who has a least a 10 year history of hypertension as well as hyperlipidemia.  He has a history of obesity and his BMI today is 34.29.  He does not routinely exercise.  He has noticed some mild shortness of breath but denies any episodes of chest discomfort.  He was referred to establish cardiology care in light of his cardiac risk factors.  At present, he denies any symptoms suggestive of angina.  However, with his long-standing history of hypertension, I am recommending that he undergo a 2-D echo Doppler study to evaluate both systolic and diastolic function.  In addition to valvular architecture.  His blood pressure today is stable on his current therapy of lisinopril 20 mg and amlodipine 5 mg.  He is taking a baby aspirin for antiplatelet benefit.  I have recommended weight loss.  I have recommended that he begin exercising at least 5 days per week for 30 minutes.  If at all possible.  This will improve his cardiovascular fitness and assist in weight loss.  At present, he feels he sleeps well.  He is unaware of associated sleep disordered breathing but does snore mildly.  There is no hypersomnolence.  I will try to obtain recent blood work which was done at [facility] by Dr. Crist Infante.  I will see him in 6 months for cardiology reevaluation.   Medication Adjustments/Labs and Tests Ordered: Current medicines are reviewed at length with the patient today.  Concerns regarding medicines are outlined above.  Medication changes, Labs and Tests ordered today are listed in the Patient Instructions below.  Patient Instructions  Your physician has requested that you have an echocardiogram. Echocardiography is a painless test that uses sound waves to create images of your heart. It provides your doctor with information about the size and shape of your heart and how well your heart's chambers and valves are  working. This procedure takes approximately one hour. There are no restrictions for this procedure.  This will be done at the [facility].  Your physician recommends that you schedule a follow-up appointment in: 6 months or sooner if needed.      Signed, Shelva Majestic, MD  10/14/2016 10:23  [facility] [address], [place], [place]  [address] Phone: [phone]

## 2016-10-30 ENCOUNTER — Encounter: Payer: Self-pay | Admitting: Internal Medicine

## 2016-11-13 ENCOUNTER — Other Ambulatory Visit (HOSPITAL_COMMUNITY): Payer: BLUE CROSS/BLUE SHIELD

## 2016-11-29 ENCOUNTER — Telehealth (HOSPITAL_COMMUNITY): Payer: Self-pay | Admitting: Cardiovascular Disease

## 2016-11-29 NOTE — Telephone Encounter (Signed)
Called pt and spoke with him to reschedule his echo from 12/20 at 3:00 to 12/21 at 9:30am due to the technician not being in the office to perform this procedure. He voiced understanding.Marland KitchenRG

## 2016-12-05 ENCOUNTER — Other Ambulatory Visit (HOSPITAL_COMMUNITY): Payer: BLUE CROSS/BLUE SHIELD

## 2016-12-06 ENCOUNTER — Ambulatory Visit (HOSPITAL_COMMUNITY): Payer: BLUE CROSS/BLUE SHIELD | Attending: Internal Medicine

## 2016-12-06 ENCOUNTER — Other Ambulatory Visit: Payer: Self-pay

## 2016-12-06 DIAGNOSIS — I10 Essential (primary) hypertension: Secondary | ICD-10-CM | POA: Insufficient documentation

## 2016-12-26 ENCOUNTER — Encounter: Payer: Self-pay | Admitting: Internal Medicine

## 2017-01-06 IMAGING — RF DG CHOLANGIOGRAM OPERATIVE
1 series · 5 of 5 positions shown · non-contrast
Comparison: Ultrasound 12/16/2015

CLINICAL DATA: Cholelithiasis without obstruction.

EXAM:
INTRAOPERATIVE CHOLANGIOGRAM
TECHNIQUE: Cholangiographic images from the C-arm fluoroscopic device were
submitted for interpretation post-operatively. Please see the
procedural report for the amount of contrast and the fluoroscopy
time utilized.

[Series 1: run · 2 acquisitions, 5 frames shown]
[im 1/2]
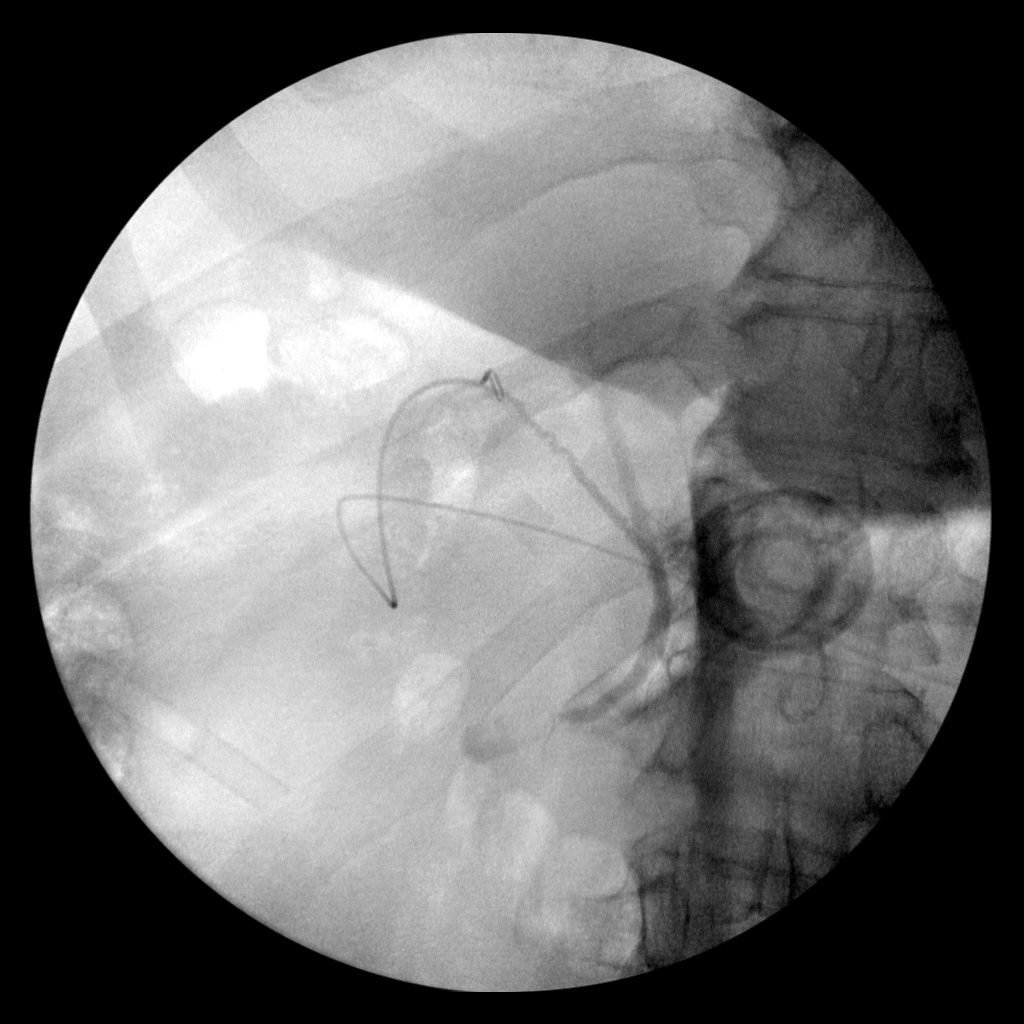
[im 1/2]
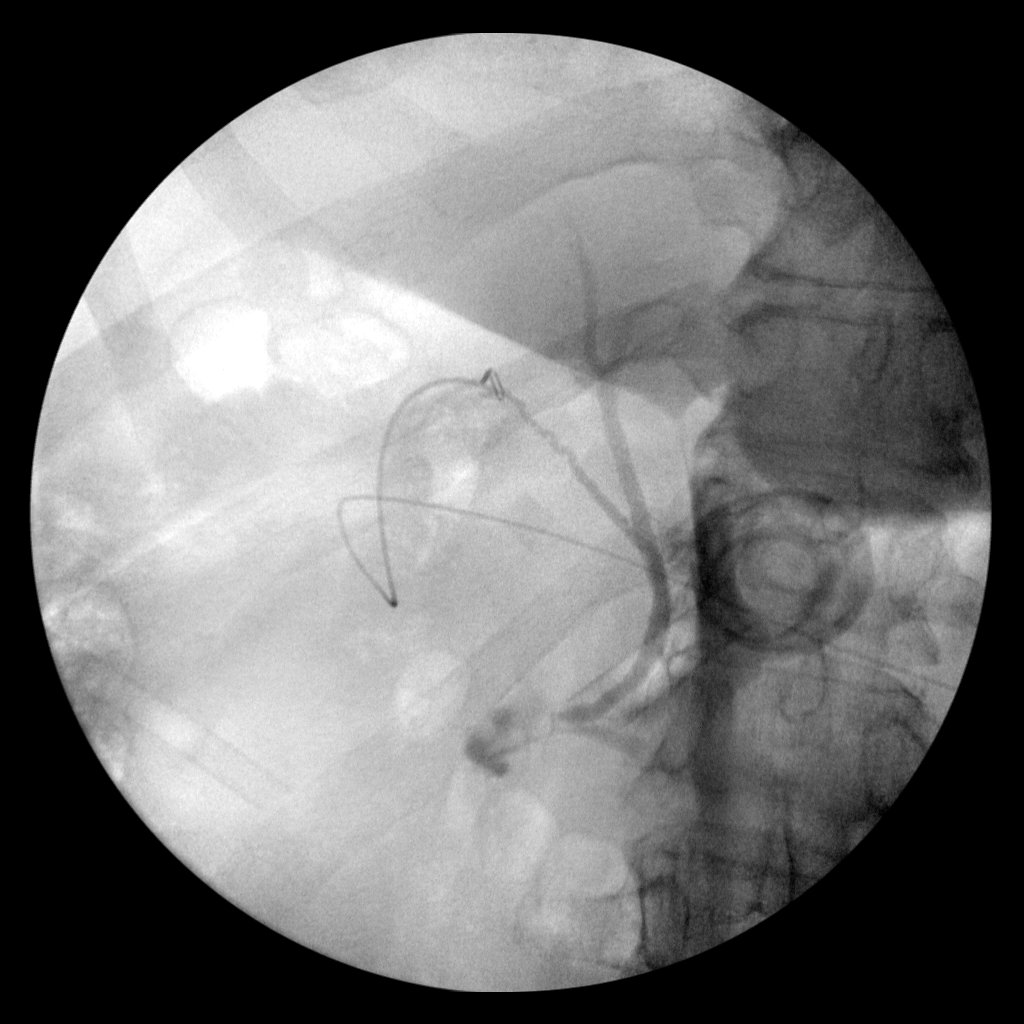
[im 1/2]
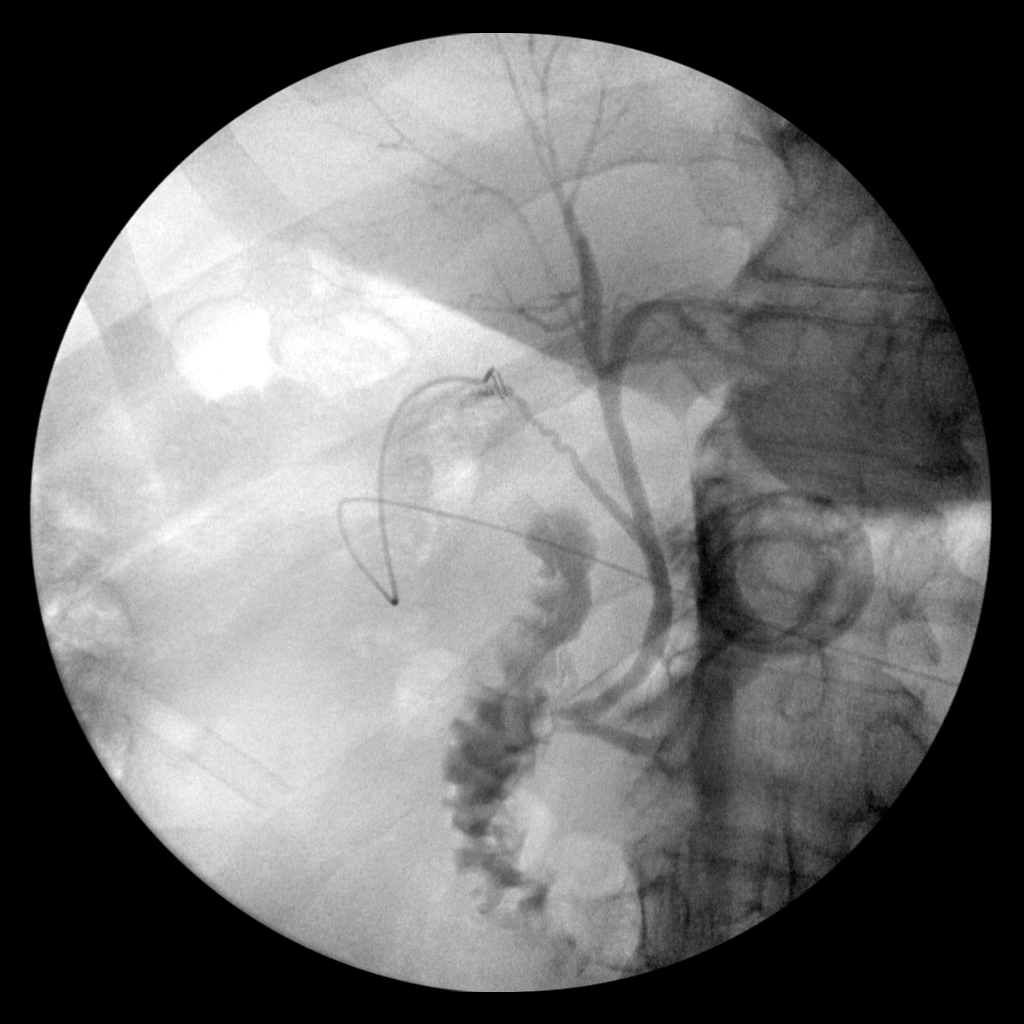
[im 1/2]
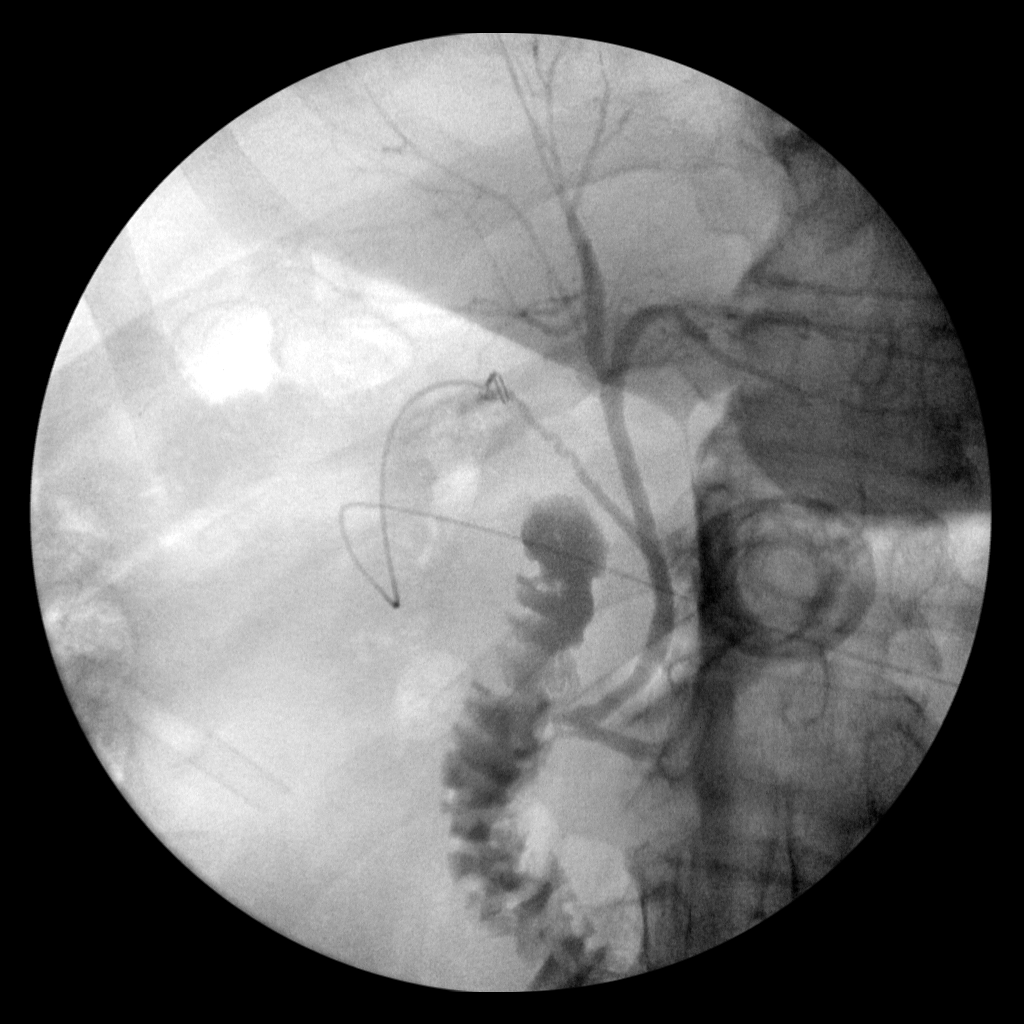
[im 2/2]
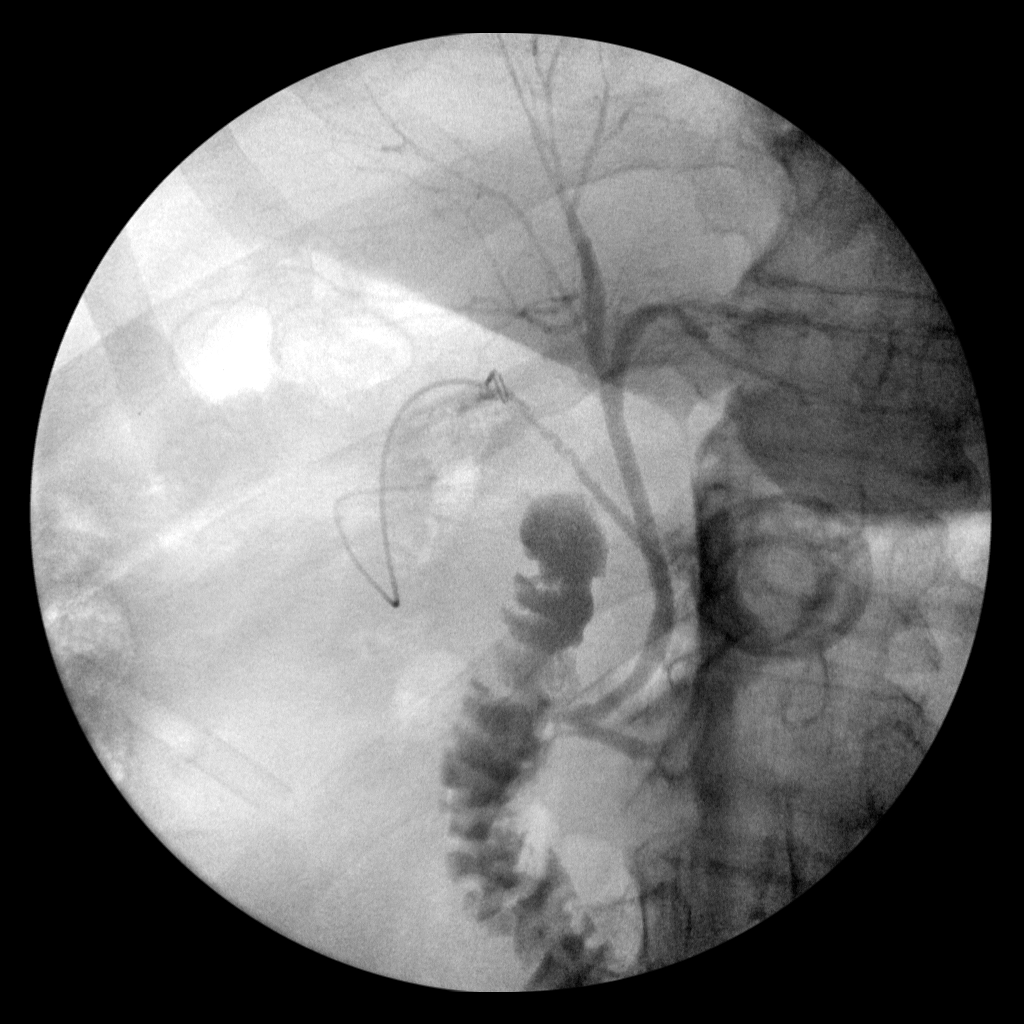

[5 of 5 positions shown; findings below may reference images not displayed]

FINDINGS: Intrahepatic and extrahepatic biliary systems are patent without
filling defects or dilatation. There is reflux into the main
pancreatic duct. Contrast drains into the duodenum.
IMPRESSION: Normal intraoperative cholangiogram.

## 2020-02-08 DIAGNOSIS — E7849 Other hyperlipidemia: Secondary | ICD-10-CM | POA: Diagnosis not present

## 2020-02-08 DIAGNOSIS — Z125 Encounter for screening for malignant neoplasm of prostate: Secondary | ICD-10-CM | POA: Diagnosis not present

## 2020-02-08 DIAGNOSIS — E559 Vitamin D deficiency, unspecified: Secondary | ICD-10-CM | POA: Diagnosis not present

## 2020-02-08 DIAGNOSIS — R7301 Impaired fasting glucose: Secondary | ICD-10-CM | POA: Diagnosis not present

## 2020-02-10 DIAGNOSIS — I10 Essential (primary) hypertension: Secondary | ICD-10-CM | POA: Diagnosis not present

## 2020-02-10 DIAGNOSIS — R82998 Other abnormal findings in urine: Secondary | ICD-10-CM | POA: Diagnosis not present

## 2020-02-15 DIAGNOSIS — F329 Major depressive disorder, single episode, unspecified: Secondary | ICD-10-CM | POA: Diagnosis not present

## 2020-02-15 DIAGNOSIS — E785 Hyperlipidemia, unspecified: Secondary | ICD-10-CM | POA: Diagnosis not present

## 2020-02-15 DIAGNOSIS — D126 Benign neoplasm of colon, unspecified: Secondary | ICD-10-CM | POA: Diagnosis not present

## 2020-02-15 DIAGNOSIS — L821 Other seborrheic keratosis: Secondary | ICD-10-CM | POA: Diagnosis not present

## 2020-02-15 DIAGNOSIS — Z0000 Encounter for general adult medical examination without abnormal findings: Secondary | ICD-10-CM | POA: Diagnosis not present

## 2020-02-15 DIAGNOSIS — K219 Gastro-esophageal reflux disease without esophagitis: Secondary | ICD-10-CM | POA: Diagnosis not present

## 2020-02-15 DIAGNOSIS — R6889 Other general symptoms and signs: Secondary | ICD-10-CM | POA: Diagnosis not present

## 2020-02-15 DIAGNOSIS — M545 Low back pain: Secondary | ICD-10-CM | POA: Diagnosis not present

## 2020-02-15 DIAGNOSIS — H04129 Dry eye syndrome of unspecified lacrimal gland: Secondary | ICD-10-CM | POA: Diagnosis not present

## 2020-02-15 DIAGNOSIS — R7301 Impaired fasting glucose: Secondary | ICD-10-CM | POA: Diagnosis not present

## 2020-02-15 DIAGNOSIS — E7849 Other hyperlipidemia: Secondary | ICD-10-CM | POA: Diagnosis not present

## 2020-02-15 DIAGNOSIS — E559 Vitamin D deficiency, unspecified: Secondary | ICD-10-CM | POA: Diagnosis not present

## 2020-02-18 ENCOUNTER — Encounter: Payer: Self-pay | Admitting: Internal Medicine

## 2020-03-07 ENCOUNTER — Other Ambulatory Visit: Payer: Self-pay

## 2020-03-07 ENCOUNTER — Ambulatory Visit (AMBULATORY_SURGERY_CENTER): Payer: Self-pay | Admitting: *Deleted

## 2020-03-07 VITALS — Temp 96.9°F | Ht 70.0 in | Wt 190.0 lb

## 2020-03-07 DIAGNOSIS — Z86010 Personal history of colonic polyps: Secondary | ICD-10-CM

## 2020-03-07 MED ORDER — NA SULFATE-K SULFATE-MG SULF 17.5-3.13-1.6 GM/177ML PO SOLN: ORAL | 0 refills | Status: DC

## 2020-03-07 NOTE — Progress Notes (Signed)
Patient is here in-person for PV. Patient denies any allergies to eggs or soy. Patient denies any problems with anesthesia/sedation. Patient denies any oxygen use at home. Patient denies taking any diet/weight loss medications or blood thinners. Patient is not being treated for MRSA or C-diff. EMMI education assisgned to the patient for the procedure, this was explained and instructions given to patient. COVID-19 screening test not needed, pt had both vaccines on 02/19/20. Patient is aware of our care-partner policy and 0000000 safety protocol.   Patient aware of the cost of Suprep.

## 2020-03-11 DIAGNOSIS — Z1212 Encounter for screening for malignant neoplasm of rectum: Secondary | ICD-10-CM | POA: Diagnosis not present

## 2020-03-21 ENCOUNTER — Encounter: Payer: PPO | Admitting: Internal Medicine

## 2020-04-22 ENCOUNTER — Encounter: Payer: Self-pay | Admitting: Internal Medicine

## 2020-04-27 ENCOUNTER — Other Ambulatory Visit: Payer: Self-pay

## 2020-04-27 ENCOUNTER — Encounter: Payer: Self-pay | Admitting: Internal Medicine

## 2020-04-27 ENCOUNTER — Ambulatory Visit (AMBULATORY_SURGERY_CENTER): Payer: PPO | Admitting: Internal Medicine

## 2020-04-27 VITALS — BP 95/63 | HR 60 | Temp 96.8°F | Resp 12 | Ht 70.0 in | Wt 190.0 lb

## 2020-04-27 DIAGNOSIS — D123 Benign neoplasm of transverse colon: Secondary | ICD-10-CM | POA: Diagnosis not present

## 2020-04-27 DIAGNOSIS — I10 Essential (primary) hypertension: Secondary | ICD-10-CM | POA: Diagnosis not present

## 2020-04-27 DIAGNOSIS — Z86010 Personal history of colonic polyps: Secondary | ICD-10-CM | POA: Diagnosis not present

## 2020-04-27 DIAGNOSIS — E669 Obesity, unspecified: Secondary | ICD-10-CM | POA: Diagnosis not present

## 2020-04-27 MED ORDER — SODIUM CHLORIDE 0.9 % IV SOLN: 500.0000 mL | Freq: Once | INTRAVENOUS | Status: DC

## 2020-04-27 NOTE — Progress Notes (Signed)
Vs-DT  Temp- front desk- LS

## 2020-04-27 NOTE — Progress Notes (Signed)
Called to room to assist during endoscopic procedure.  Patient ID and intended procedure confirmed with present staff. Received instructions for my participation in the procedure from the performing physician.  

## 2020-04-27 NOTE — Op Note (Addendum)
David Hurley Patient Name: David Hurley Procedure Date: 04/27/2020 10:03 AM MRN: [id number] Endoscopist: Docia Chuck. [name] , MD Age: 64 Referring MD:  Date of Birth: 1956/11/27 Gender: Male Account #: [id number] Procedure:                Colonoscopy with cold snare polypectomy x 1 Indications:              High risk colon cancer surveillance: Personal                            history of multiple (3 or more) adenomas. Previous                            examinations 2009, 2013 Medicines:                Monitored Anesthesia Care Procedure:                Pre-Anesthesia Assessment:                           - Prior to the procedure, a History and Physical                            was performed, and patient medications and                            allergies were reviewed. The patient's tolerance of                            previous anesthesia was also reviewed. The risks                            and benefits of the procedure and the sedation                            options and risks were discussed with the patient.                            All questions were answered, and informed consent                            was obtained. Prior Anticoagulants: The patient has                            taken no previous anticoagulant or antiplatelet                            agents. ASA Grade Assessment: II - A patient with                            mild systemic disease. After reviewing the risks                            and benefits, the patient was deemed in  satisfactory condition to undergo the procedure.                           After obtaining informed consent, the colonoscope                            was passed under direct vision. Throughout the                            procedure, the patient's blood pressure, pulse, and                            oxygen saturations were monitored continuously. The                            Colonoscope  was introduced through the anus and                            advanced to the the cecum, identified by                            appendiceal orifice and ileocecal valve. The                            ileocecal valve, appendiceal orifice, and rectum                            were photographed. The quality of the bowel                            preparation was excellent. The colonoscopy was                            performed without difficulty. The patient tolerated                            the procedure well. The bowel preparation used was                            SUPREP via split dose instruction. Scope In: 10:22:01 AM Scope Out: 10:33:07 AM Scope Withdrawal Time: 0 hours 8 minutes 36 seconds  Total Procedure Duration: 0 hours 11 minutes 6 seconds  Findings:                 A 1 mm polyp was found in the transverse colon. The                            polyp was removed with a cold snare. Resection and                            retrieval were complete.                           Scattered diverticula were found in the left colon  and right colon.                           Internal hemorrhoids were found during retroflexion.                           The exam was otherwise without abnormality on                            direct and retroflexion views. Complications:            No immediate complications. Estimated blood loss:                            None. Estimated Blood Loss:     Estimated blood loss: none. Impression:               - One 1 mm polyp in the transverse colon, removed                            with a cold snare. Resected and retrieved.                           - Diverticulosis in the left colon and in the right                            colon.                           - Internal hemorrhoids.                           - The examination was otherwise normal on direct                            and retroflexion  views. Recommendation:           - Repeat colonoscopy in 5 years for surveillance                            (personal history of multiple adenomas).                           - Patient has a contact number available for                            emergencies. The signs and symptoms of potential                            delayed complications were discussed with the                            patient. Return to normal activities tomorrow.                            Written discharge instructions were provided to the  patient.                           - Resume previous diet.                           - Continue present medications.                           - Await pathology results. Docia Chuck. [name], MD 04/27/2020 10:41:56 AM This report has been signed electronically.

## 2020-04-27 NOTE — Patient Instructions (Signed)
Please read handouts provided. Continue present medications. Await pathology results.   YOU HAD AN ENDOSCOPIC PROCEDURE TODAY AT THE [facility]:   Refer to the procedure report that was given to you for any specific questions about what was found during the examination.  If the procedure report does not answer your questions, please call your gastroenterologist to clarify.  If you requested that your care partner not be given the details of your procedure findings, then the procedure report has been included in a sealed envelope for you to review at your convenience later.  YOU SHOULD EXPECT: Some feelings of bloating in the abdomen. Passage of more gas than usual.  Walking can help get rid of the air that was put into your GI tract during the procedure and reduce the bloating. If you had a lower endoscopy (such as a colonoscopy or flexible sigmoidoscopy) you may notice spotting of blood in your stool or on the toilet paper. If you underwent a bowel prep for your procedure, you may not have a normal bowel movement for a few days.  Please Note:  You might notice some irritation and congestion in your nose or some drainage.  This is from the oxygen used during your procedure.  There is no need for concern and it should clear up in a day or so.  SYMPTOMS TO REPORT IMMEDIATELY:  Following lower endoscopy (colonoscopy or flexible sigmoidoscopy):  Excessive amounts of blood in the stool  Significant tenderness or worsening of abdominal pains  Swelling of the abdomen that is new, acute  Fever of 100F or higher   For urgent or emergent issues, a gastroenterologist can be reached at any hour by calling [phone]. Do not use MyChart messaging for urgent concerns.    DIET:  We do recommend a small meal at first, but then you may proceed to your regular diet.  Drink plenty of fluids but you should avoid alcoholic beverages for 24 hours.  ACTIVITY:  You should plan to take it easy  for the rest of today and you should NOT DRIVE or use heavy machinery until tomorrow (because of the sedation medicines used during the test).    FOLLOW UP: Our staff will call the number listed on your records 48-72 hours following your procedure to check on you and address any questions or concerns that you may have regarding the information given to you following your procedure. If we do not reach you, we will leave a message.  We will attempt to reach you two times.  During this call, we will ask if you have developed any symptoms of COVID 19. If you develop any symptoms (ie: fever, flu-like symptoms, shortness of breath, cough etc.) before then, please call [phone].  If you test positive for Covid 19 in the 2 weeks post procedure, please call and report this information to us.    If any biopsies were taken you will be contacted by phone or by letter within the next 1-3 weeks.  Please call us at [phone] if you have not heard about the biopsies in 3 weeks.    SIGNATURES/CONFIDENTIALITY: You and/or your care partner have signed paperwork which will be entered into your electronic medical record.  These signatures attest to the fact that that the information above on your After Visit Summary has been reviewed and is understood.  Full responsibility of the confidentiality of this discharge information lies with you and/or your care-partner.  

## 2020-04-27 NOTE — Progress Notes (Signed)
Report to PACU, RN, vss, BBS= Clear.  

## 2020-04-29 ENCOUNTER — Encounter: Payer: Self-pay | Admitting: Internal Medicine

## 2020-04-29 ENCOUNTER — Telehealth: Payer: Self-pay | Admitting: *Deleted

## 2020-04-29 NOTE — Telephone Encounter (Signed)
  Follow up Call-  Call back number 04/27/2020  Post procedure Call Back phone  # [phone]  Permission to leave phone message Yes  Some recent data might be hidden   LMOM to call back with any questions or concerns.  Also, call back if patient has developed fever, respiratory issues or been dx with COVID or had any family members or close contacts diagnosed since her procedure.

## 2020-04-29 NOTE — Telephone Encounter (Signed)
Attempted f/u phone call. No answer. Left message. °

## 2020-06-15 DIAGNOSIS — F329 Major depressive disorder, single episode, unspecified: Secondary | ICD-10-CM | POA: Diagnosis not present

## 2020-06-15 DIAGNOSIS — R1013 Epigastric pain: Secondary | ICD-10-CM | POA: Diagnosis not present

## 2020-06-15 DIAGNOSIS — R0989 Other specified symptoms and signs involving the circulatory and respiratory systems: Secondary | ICD-10-CM | POA: Diagnosis not present

## 2020-06-15 DIAGNOSIS — K219 Gastro-esophageal reflux disease without esophagitis: Secondary | ICD-10-CM | POA: Diagnosis not present

## 2020-06-15 DIAGNOSIS — J392 Other diseases of pharynx: Secondary | ICD-10-CM | POA: Diagnosis not present

## 2020-06-15 DIAGNOSIS — J300 Vasomotor rhinitis: Secondary | ICD-10-CM | POA: Diagnosis not present

## 2020-08-01 DIAGNOSIS — K219 Gastro-esophageal reflux disease without esophagitis: Secondary | ICD-10-CM | POA: Diagnosis not present

## 2020-09-26 DIAGNOSIS — I10 Essential (primary) hypertension: Secondary | ICD-10-CM | POA: Diagnosis not present

## 2020-09-26 DIAGNOSIS — E785 Hyperlipidemia, unspecified: Secondary | ICD-10-CM | POA: Diagnosis not present

## 2020-09-26 DIAGNOSIS — R7301 Impaired fasting glucose: Secondary | ICD-10-CM | POA: Diagnosis not present

## 2020-09-26 DIAGNOSIS — F329 Major depressive disorder, single episode, unspecified: Secondary | ICD-10-CM | POA: Diagnosis not present

## 2020-09-26 DIAGNOSIS — K5730 Diverticulosis of large intestine without perforation or abscess without bleeding: Secondary | ICD-10-CM | POA: Diagnosis not present

## 2020-09-26 DIAGNOSIS — Z23 Encounter for immunization: Secondary | ICD-10-CM | POA: Diagnosis not present

## 2020-09-26 DIAGNOSIS — K219 Gastro-esophageal reflux disease without esophagitis: Secondary | ICD-10-CM | POA: Diagnosis not present

## 2020-11-18 DIAGNOSIS — Z79891 Long term (current) use of opiate analgesic: Secondary | ICD-10-CM | POA: Diagnosis not present

## 2020-11-18 DIAGNOSIS — F331 Major depressive disorder, recurrent, moderate: Secondary | ICD-10-CM | POA: Diagnosis not present

## 2020-11-25 ENCOUNTER — Other Ambulatory Visit: Payer: Self-pay | Admitting: Internal Medicine

## 2020-11-25 DIAGNOSIS — M5440 Lumbago with sciatica, unspecified side: Secondary | ICD-10-CM

## 2020-12-01 ENCOUNTER — Ambulatory Visit (INDEPENDENT_AMBULATORY_CARE_PROVIDER_SITE_OTHER): Payer: PPO | Admitting: Gastroenterology

## 2020-12-01 ENCOUNTER — Encounter: Payer: Self-pay | Admitting: Gastroenterology

## 2020-12-01 VITALS — BP 110/74 | HR 74 | Ht 70.5 in | Wt 180.0 lb

## 2020-12-01 DIAGNOSIS — R1310 Dysphagia, unspecified: Secondary | ICD-10-CM | POA: Diagnosis not present

## 2020-12-01 DIAGNOSIS — J029 Acute pharyngitis, unspecified: Secondary | ICD-10-CM | POA: Diagnosis not present

## 2020-12-01 DIAGNOSIS — Z1152 Encounter for screening for COVID-19: Secondary | ICD-10-CM | POA: Diagnosis not present

## 2020-12-01 DIAGNOSIS — J069 Acute upper respiratory infection, unspecified: Secondary | ICD-10-CM | POA: Diagnosis not present

## 2020-12-01 NOTE — Progress Notes (Signed)
12/01/2020 David Hurley [id number] 1956/06/02   HISTORY OF PRESENT ILLNESS: This is a pleasant 64 year old male who is a patient of Dr. Blanch Media.  He is known to him primarily only for colonoscopies.  He is here today with complaints of dysphagia.  He tells me this has been going on for about a year.  He describes it as having a "hard time swallowing".  He says that things feel hard to go down.  Nothing actually gets stuck.  This happens with both solids and liquids.  He points to his upper/cervical esophagus.  He states that years ago he had a lot of heartburn and reflux and had been on Nexium, etc.  He said that he lost a lot of weight and his acid reflux got better.  He says about a month and half ago his PCP placed him on pantoprazole 40 mg, he started once daily and then increased to twice daily.  He states that it has helped with some of the upper abdominal discomfort that he was having, but did not really help with the dysphagia.  He sees Dr. Wilburn Cornelia for ENT and was recently evaluated by him with a nasolaryngoscopy without issues.  EGD in November 2003 showed GERD.   Past Medical History:  Diagnosis Date  . Anxiety   . Borderline systolic HTN   . Colonic adenoma   . Decreased hearing   . Depression   . Deviated septum   . Environmental allergies   . Fatty liver   . GERD (gastroesophageal reflux disease)   . Gilbert's syndrome   . Hypercholesterolemia   . Hypertension   . Kidney stone   . Obesity (BMI 30.0-34.9)   . Pneumonia    hx of   . Shingles 2011  . Vitamin D insufficiency    Past Surgical History:  Procedure Laterality Date  . broken leg  1970   teenager  right  . callused vocal cord scraping     Dr Remer Macho ENT on [place] N/A 12/23/2015   Procedure: LAPAROSCOPIC CHOLECYSTECTOMY WITH INTRAOPERATIVE CHOLANGIOGRAM;  Surgeon: Armandina Gemma, MD;  Location: [facility];  Service: General;  Laterality: N/A;  . COLONOSCOPY  02/01/2012  .  POLYPECTOMY    . tooth implant      reports that he has never smoked. He has never used smokeless tobacco. He reports current alcohol use of about 1.0 standard drink of alcohol per week. He reports that he does not use drugs. family history includes Bipolar disorder in his son; Diabetes in his father; Heart disease in his mother; Hypertension in his mother; Prostate cancer in his brother. Allergies  Allergen Reactions  . Advil [Ibuprofen] Other (See Comments)    GI upset  . Asa [Aspirin] Other (See Comments)    Dr advised against using due to GI upset      Outpatient Encounter Medications as of 12/01/2020  Medication Sig  . ALPRAZolam (XANAX) 1 MG tablet Take 1 mg by mouth daily as needed for anxiety or sleep.   Marland Kitchen amLODipine (NORVASC) 5 MG tablet Take 5 mg by mouth daily. Patient takes in the pm  . gabapentin (NEURONTIN) 300 MG capsule Take 600 mg by mouth at bedtime.   Marland Kitchen lisinopril (ZESTRIL) 10 MG tablet Take 10 mg by mouth daily.  . rosuvastatin (CRESTOR) 20 MG tablet Take 20 mg by mouth daily.  . [DISCONTINUED] Cholecalciferol (VITAMIN D3 PO) Take 2,000 mg by mouth daily.  . [DISCONTINUED] Cyanocobalamin (  VITAMIN B 12 PO) Take 500 mg by mouth daily.  . [DISCONTINUED] Zinc 50 MG CAPS Take by mouth.   No facility-administered encounter medications on file as of 12/01/2020.    REVIEW OF SYSTEMS  : All other systems reviewed and negative except where noted in the History of Present Illness.   PHYSICAL EXAM: BP 110/74   Pulse 74   Ht 5' 10.5" (1.791 m)   Wt 180 lb (81.6 kg)   BMI 25.46 kg/m  General: Well developed white male in no acute distress Head: Normocephalic and atraumatic Eyes:  Sclerae anicteric, conjunctiva pink. Ears: Normal auditory acuity Neck:  No goiter noted. Lungs: Clear throughout to auscultation; no W/R/R. Heart: Regular rate and rhythm; no M/R/G. Musculoskeletal: Symmetrical with no gross deformities  Skin: No lesions on visible  extremities Extremities: No edema  Neurological: Alert oriented x 4, grossly non-focal Psychological:  Alert and cooperative. Normal mood and affect  ASSESSMENT AND PLAN: *Dysphagia: Describes having a "hard time swallowing" to both solids and liquids.  Describes that they feel like they are hard to go down, nothing gets stuck.  Points to upper/cervical esophagus.  I think that we will start with an esophagram first to rule out motility issue versus stricture, etc.  Pending results may need EGD versus manometry versus speech pathology/modified barium swallow study.  Continue pantoprazole 40 mg twice daily for now.   CC:  Crist Infante, MD

## 2020-12-01 NOTE — Progress Notes (Signed)
Assessment and plan reviewed 

## 2020-12-01 NOTE — Patient Instructions (Signed)
If you are age 65 or older, your body mass index should be between 23-30. Your Body mass index is 25.46 kg/m. If this is out of the aforementioned range listed, please consider follow up with your Primary Care Provider.  If you are age 52 or younger, your body mass index should be between 19-25. Your Body mass index is 25.46 kg/m. If this is out of the aformentioned range listed, please consider follow up with your Primary Care Provider.   You have been scheduled for a Barium Esophogram at [facility] (1st floor of the hospital) on Wednesday 12/14/20 at 10:30 am. Please arrive 15 minutes prior to your appointment for registration. Make certain not to have anything to eat or drink 3 hours prior to your test. If you need to reschedule for any reason, please contact radiology at [phone] to do so. _______________________________________________________________ A barium swallow is an examination that concentrates on views of the esophagus. This tends to be a double contrast exam (barium and two liquids which, when combined, create a gas to distend the wall of the oesophagus) or single contrast (non-ionic iodine based). The study is usually tailored to your symptoms so a good history is essential. Attention is paid during the study to the form, structure and configuration of the esophagus, looking for functional disorders (such as aspiration, dysphagia, achalasia, motility and reflux) EXAMINATION You may be asked to change into a gown, depending on the type of swallow being performed. A radiologist and radiographer will perform the procedure. The radiologist will advise you of the type of contrast selected for your procedure and direct you during the exam. You will be asked to stand, sit or lie in several different positions and to hold a small amount of fluid in your mouth before being asked to swallow while the imaging is performed .In some instances you may be asked to swallow barium coated  marshmallows to assess the motility of a solid food bolus. The exam can be recorded as a digital or video fluoroscopy procedure. POST PROCEDURE It will take 1-2 days for the barium to pass through your system. To facilitate this, it is important, unless otherwise directed, to increase your fluids for the next 24-48hrs and to resume your normal diet.  This test typically takes about 30 minutes to perform. _______________________________________________________________

## 2020-12-08 DIAGNOSIS — F331 Major depressive disorder, recurrent, moderate: Secondary | ICD-10-CM | POA: Diagnosis not present

## 2020-12-14 ENCOUNTER — Other Ambulatory Visit: Payer: Self-pay

## 2020-12-14 ENCOUNTER — Ambulatory Visit (HOSPITAL_COMMUNITY)
Admission: RE | Admit: 2020-12-14 | Discharge: 2020-12-14 | Disposition: A | Discharge status: Home / Self Care | Payer: PPO | Source: Ambulatory Visit | Attending: Gastroenterology | Admitting: Gastroenterology

## 2020-12-14 DIAGNOSIS — R1310 Dysphagia, unspecified: Secondary | ICD-10-CM | POA: Diagnosis not present

## 2020-12-14 DIAGNOSIS — K224 Dyskinesia of esophagus: Secondary | ICD-10-CM | POA: Diagnosis not present

## 2020-12-14 DIAGNOSIS — K219 Gastro-esophageal reflux disease without esophagitis: Secondary | ICD-10-CM | POA: Diagnosis not present

## 2020-12-22 ENCOUNTER — Ambulatory Visit: Payer: PPO | Admitting: Internal Medicine

## 2020-12-22 ENCOUNTER — Other Ambulatory Visit: Payer: Self-pay

## 2020-12-22 ENCOUNTER — Encounter: Payer: Self-pay | Admitting: Internal Medicine

## 2020-12-22 ENCOUNTER — Ambulatory Visit
Admission: RE | Admit: 2020-12-22 | Discharge: 2020-12-22 | Disposition: A | Discharge status: Home / Self Care | Payer: PPO | Source: Ambulatory Visit | Attending: Internal Medicine | Admitting: Internal Medicine

## 2020-12-22 VITALS — BP 134/64 | HR 72 | Ht 69.0 in | Wt 204.0 lb

## 2020-12-22 DIAGNOSIS — R1310 Dysphagia, unspecified: Secondary | ICD-10-CM

## 2020-12-22 DIAGNOSIS — Z86010 Personal history of colonic polyps: Secondary | ICD-10-CM

## 2020-12-22 DIAGNOSIS — R1312 Dysphagia, oropharyngeal phase: Secondary | ICD-10-CM

## 2020-12-22 DIAGNOSIS — M545 Low back pain, unspecified: Secondary | ICD-10-CM | POA: Diagnosis not present

## 2020-12-22 DIAGNOSIS — R933 Abnormal findings on diagnostic imaging of other parts of digestive tract: Secondary | ICD-10-CM | POA: Diagnosis not present

## 2020-12-22 DIAGNOSIS — M48061 Spinal stenosis, lumbar region without neurogenic claudication: Secondary | ICD-10-CM | POA: Diagnosis not present

## 2020-12-22 DIAGNOSIS — M5440 Lumbago with sciatica, unspecified side: Secondary | ICD-10-CM

## 2020-12-22 NOTE — Progress Notes (Unsigned)
HISTORY OF PRESENT ILLNESS:  David Atkerson. is a 65 y.o. male, owner of [employer], who is followed by myself for personal history of multiple adenomatous colon polyps having undergone colonoscopy in 2009, 2013, and May 2021.  He was seen by the GI physician assistant December 01, 2020 regarding difficulties with swallowing.  He pointed to the upper cervical esophagus during the interview.  See that dictation.  He did undergo an ENT evaluation which was reportedly negative.  His difficulties with swallowing have been occurring for a little over 1 year.  He describes difficulties with liquids which resulted in aspiration and coughing.  Does have occasional esophageal solid food dysphagia with large bites, but this is rare.  He has no problems with coughing spells when he is not eating.  He does have occasional symptoms at night.  He has been on Nexium for many years for GERD.  Over the past 3 to 4 months his PPI was increased to twice daily.  No change in symptoms.  He did undergo a barium esophagram December 14, 2020.  He was said to have mild esophageal dysmotility, moderate reflux, no stricture, easy passage of barium tablet.  However, he was also noted to have 1 episode of mild to moderate aspiration with coughing.  Patient does mention that he has learned on his own that a chin tuck maneuver seems to help his symptoms.  He has completed his COVID vaccination series  REVIEW OF SYSTEMS:  All non-GI ROS negative. Past Medical History:  Diagnosis Date  . Anxiety   . Borderline systolic HTN   . Colonic adenoma   . Decreased hearing   . Depression   . Deviated septum   . Environmental allergies   . Fatty liver   . GERD (gastroesophageal reflux disease)   . Gilbert's syndrome   . Hypercholesterolemia   . Hypertension   . Kidney stone   . Obesity (BMI 30.0-34.9)   . Pneumonia    hx of   . Shingles 2011  . Vitamin D insufficiency     Past Surgical History:  Procedure  Laterality Date  . broken leg  1970   teenager  right  . callused vocal cord scraping     Dr Molli Barrows ENT on [facility]  . CHOLECYSTECTOMY N/A 12/23/2015   Procedure: LAPAROSCOPIC CHOLECYSTECTOMY WITH INTRAOPERATIVE CHOLANGIOGRAM;  Surgeon: Darnell Level, MD;  Location: [facility];  Service: General;  Laterality: N/A;  . COLONOSCOPY  02/01/2012  . POLYPECTOMY    . tooth implant      Social History David Hurley.  reports that he has never smoked. He has never used smokeless tobacco. He reports previous alcohol use of about 1.0 standard drink of alcohol per week. He reports that he does not use drugs.  family history includes Bipolar disorder in his son; Diabetes in his father; Heart disease in his mother; Hypertension in his mother; Prostate cancer in his brother.  Allergies  Allergen Reactions  . Advil [Ibuprofen] Other (See Comments)    GI upset  . Asa [Aspirin] Other (See Comments)    Dr advised against using due to GI upset       PHYSICAL EXAMINATION: Vital signs: BP 134/64 (BP Location: Left Arm, Patient Position: Sitting, Cuff Size: Normal)   Pulse 72   Ht 5\' 9"  (1.753 m) Comment: height measured without shoes  Wt 204 lb (92.5 kg)   BMI 30.13 kg/m   Constitutional: generally well-appearing, no acute distress Psychiatric:  alert and oriented x3, cooperative Eyes: extraocular movements intact, anicteric, conjunctiva pink Mouth: oral pharynx moist, no lesions.  No pharyngeal abnormalities Neck: supple no lymphadenopathy Cardiovascular: heart regular rate and rhythm, no murmur Lungs: clear to auscultation bilaterally Abdomen: soft, nontender, nondistended, no obvious ascites, no peritoneal signs, normal bowel sounds, no organomegaly Rectal: Omitted Extremities: no clubbing, cyanosis, or lower extremity edema bilaterally Skin: no lesions on visible extremities Neuro: No focal deficits.  Cranial nerves intact  ASSESSMENT:  1.  Patient describes appropriate dysphagia.   Barium esophagram revealed no evidence of aspiration or coughing.  Etiology unclear 2.  History of adenomatous colon polyps.  Surveillance up-to-date   PLAN:  1.  Schedule upper endoscopy to rule out subtle anatomic abnormality is not appreciated on esophagram.The nature of the procedure, as well as the risks, benefits, and alternatives were carefully and thoroughly reviewed with the patient. Ample time for discussion and questions allowed. The patient understood, was satisfied, and agreed to proceed. 2.  If endoscopy unrevealing, then refer to speech pathology for formal evaluation and treatment recommendations 3.  May consider neurologic evaluation to help try to ascertain the cause of his symptoms.

## 2020-12-22 NOTE — Patient Instructions (Signed)
If you are age 66 or older, your body mass index should be between 23-30. Your Body mass index is 30.13 kg/m. If this is out of the aforementioned range listed, please consider follow up with your Primary Care Provider.  If you are age 6 or younger, your body mass index should be between 19-25. Your Body mass index is 30.13 kg/m. If this is out of the aformentioned range listed, please consider follow up with your Primary Care Provider.   You have been scheduled for an endoscopy. Please follow written instructions given to you at your visit today. If you use inhalers (even only as needed), please bring them with you on the day of your procedure.

## 2020-12-26 DIAGNOSIS — F331 Major depressive disorder, recurrent, moderate: Secondary | ICD-10-CM | POA: Diagnosis not present

## 2020-12-27 ENCOUNTER — Other Ambulatory Visit: Payer: Self-pay | Admitting: Internal Medicine

## 2020-12-27 DIAGNOSIS — N329 Bladder disorder, unspecified: Secondary | ICD-10-CM

## 2021-01-11 DIAGNOSIS — F331 Major depressive disorder, recurrent, moderate: Secondary | ICD-10-CM | POA: Diagnosis not present

## 2021-01-13 ENCOUNTER — Ambulatory Visit
Admission: RE | Admit: 2021-01-13 | Discharge: 2021-01-13 | Disposition: A | Discharge status: Home / Self Care | Payer: PPO | Source: Ambulatory Visit | Attending: Internal Medicine | Admitting: Internal Medicine

## 2021-01-13 DIAGNOSIS — N3289 Other specified disorders of bladder: Secondary | ICD-10-CM | POA: Diagnosis not present

## 2021-01-13 DIAGNOSIS — N329 Bladder disorder, unspecified: Secondary | ICD-10-CM

## 2021-01-13 MED ORDER — IOPAMIDOL (ISOVUE-300) INJECTION 61%
100.0000 mL | Freq: Once | INTRAVENOUS | Status: AC | PRN
  Administered 2021-01-13: 100 mL via INTRAVENOUS

## 2021-01-17 ENCOUNTER — Other Ambulatory Visit: Payer: Self-pay

## 2021-01-17 ENCOUNTER — Ambulatory Visit (AMBULATORY_SURGERY_CENTER): Payer: PPO | Admitting: Internal Medicine

## 2021-01-17 ENCOUNTER — Encounter: Payer: Self-pay | Admitting: Internal Medicine

## 2021-01-17 VITALS — BP 107/66 | HR 57 | Temp 97.7°F | Resp 13 | Ht 69.0 in | Wt 204.0 lb

## 2021-01-17 DIAGNOSIS — R1312 Dysphagia, oropharyngeal phase: Secondary | ICD-10-CM | POA: Diagnosis not present

## 2021-01-17 DIAGNOSIS — R1310 Dysphagia, unspecified: Secondary | ICD-10-CM | POA: Diagnosis not present

## 2021-01-17 DIAGNOSIS — R933 Abnormal findings on diagnostic imaging of other parts of digestive tract: Secondary | ICD-10-CM

## 2021-01-17 MED ORDER — SODIUM CHLORIDE 0.9 % IV SOLN: 500.0000 mL | Freq: Once | INTRAVENOUS | Status: DC

## 2021-01-17 NOTE — Op Note (Signed)
[facility] Patient Name: David Hurley Procedure Date: 01/17/2021 1:38 PM MRN: [id number] Endoscopist: Docia Chuck. David Hurley , MD Age: 65 Referring MD:  Date of Birth: 04-03-1956 Gender: Male Account #: [id number] Procedure:                Upper GI endoscopy Indications:              Dysphagia. Oropharyngeal. Negative ENT evaluation.                            Aspiration and coughing on standard barium                            esophagram. Medicines:                Monitored Anesthesia Care Procedure:                Pre-Anesthesia Assessment:                           - Prior to the procedure, a History and Physical                            was performed, and patient medications and                            allergies were reviewed. The patient's tolerance of                            previous anesthesia was also reviewed. The risks                            and benefits of the procedure and the sedation                            options and risks were discussed with the patient.                            All questions were answered, and informed consent                            was obtained. Prior Anticoagulants: The patient has                            taken no previous anticoagulant or antiplatelet                            agents. ASA Grade Assessment: II - A patient with                            mild systemic disease. After reviewing the risks                            and benefits, the patient was deemed in  satisfactory condition to undergo the procedure.                           After obtaining informed consent, the endoscope was                            passed under direct vision. Throughout the                            procedure, the patient's blood pressure, pulse, and                            oxygen saturations were monitored continuously. The                            Endoscope was introduced through the mouth, and                             advanced to the second part of duodenum. The upper                            GI endoscopy was accomplished without difficulty.                            The patient tolerated the procedure well. Scope In: Scope Out: Findings:                 The esophagus was normal.                           The stomach was normal.                           The examined duodenum was normal.                           The cardia and gastric fundus were normal on                            retroflexion. Complications:            No immediate complications. Estimated Blood Loss:     Estimated blood loss: none. Impression:               1. Normal EGD. Recommendation:           1. Patient has a contact number available for                            emergencies. The signs and symptoms of potential                            delayed complications were discussed with the                            patient. Return to normal activities tomorrow.  Written discharge instructions were provided to the                            patient.                           2. Resume previous diet.                           3. Continue present medications.                           4. Schedule modified barium swallow with speech                            pathology "oropharyngeal dysphagia with aspiration.                            Evaluate and provide treatment recommendations"                           5. Refer to neurology. "Oropharyngeal dysphagia                            with aspiration. Etiology unclear. Negative GI and                            ENT evaluations". Docia Chuck. David Pastor, MD 01/17/2021 2:00:50 PM This report has been signed electronically.

## 2021-01-17 NOTE — Progress Notes (Signed)
Pt's states no medical or surgical changes since previsit or office visit.  CW - vitals 

## 2021-01-17 NOTE — Progress Notes (Signed)
PT taken to PACU. Monitors in place. VSS. Report given to RN. 

## 2021-01-17 NOTE — Progress Notes (Signed)
Patient refuses to wear his mask.

## 2021-01-17 NOTE — Patient Instructions (Addendum)
Read all of the handouts given to you by your recovery room nurse.  The office will schedule an appointment with the speech pathologist and the neurologist.  YOU HAD AN ENDOSCOPIC PROCEDURE TODAY AT [facility]:   Refer to the procedure report that was given to you for any specific questions about what was found during the examination.  If the procedure report does not answer your questions, please call your gastroenterologist to clarify.  If you requested that your care partner not be given the details of your procedure findings, then the procedure report has been included in a sealed envelope for you to review at your convenience later.  YOU SHOULD EXPECT: Some feelings of bloating in the abdomen. Passage of more gas than usual.  Walking can help get rid of the air that was put into your GI tract during the procedure and reduce the bloating  Please Note:  You might notice some irritation and congestion in your nose or some drainage.  This is from the oxygen used during your procedure.  There is no need for concern and it should clear up in a day or so.  SYMPTOMS TO REPORT IMMEDIATELY:   Following upper endoscopy (EGD)  Vomiting of blood or coffee ground material  New chest pain or pain under the shoulder blades  Painful or persistently difficult swallowing  New shortness of breath  Fever of 100F or higher  Black, tarry-looking stools  For urgent or emergent issues, a gastroenterologist can be reached at any hour by calling [phone]. Do not use MyChart messaging for urgent concerns.    DIET:  We do recommend a small meal at first, but then you may proceed to your regular diet.  Drink plenty of fluids but you should avoid alcoholic beverages for 24 hours.  ACTIVITY:  You should plan to take it easy for the rest of today and you should NOT DRIVE or use heavy machinery until tomorrow (because of the sedation medicines used during the test).    FOLLOW UP: Our staff  will call the number listed on your records 48-72 hours following your procedure to check on you and address any questions or concerns that you may have regarding the information given to you following your procedure. If we do not reach you, we will leave a message.  We will attempt to reach you two times.  During this call, we will ask if you have developed any symptoms of COVID 19. If you develop any symptoms (ie: fever, flu-like symptoms, shortness of breath, cough etc.) before then, please call [phone].  If you test positive for Covid 19 in the 2 weeks post procedure, please call and report this information to [place].     SIGNATURES/CONFIDENTIALITY: You and/or your care partner have signed paperwork which will be entered into your electronic medical record.  These signatures attest to the fact that that the information above on your After Visit Summary has been reviewed and is understood.  Full responsibility of the confidentiality of this discharge information lies with you and/or your care-partner.

## 2021-01-18 ENCOUNTER — Other Ambulatory Visit: Payer: Self-pay

## 2021-01-18 DIAGNOSIS — R1310 Dysphagia, unspecified: Secondary | ICD-10-CM

## 2021-01-19 ENCOUNTER — Telehealth: Payer: Self-pay | Admitting: *Deleted

## 2021-01-19 ENCOUNTER — Encounter: Payer: Self-pay | Admitting: Neurology

## 2021-01-19 ENCOUNTER — Telehealth: Payer: Self-pay

## 2021-01-19 NOTE — Telephone Encounter (Signed)
  Follow up Call-  Call back number 01/17/2021 04/27/2020  Post procedure Call Back phone  # [phone]  Permission to leave phone message Yes Yes  Some recent data might be hidden     Patient questions:  Message left to call us if necessary.

## 2021-01-19 NOTE — Telephone Encounter (Signed)
Follow up call. No answer and VM not set-up.

## 2021-01-19 NOTE — Telephone Encounter (Signed)
Patient called back stating he is doing well.

## 2021-01-20 DIAGNOSIS — F331 Major depressive disorder, recurrent, moderate: Secondary | ICD-10-CM | POA: Diagnosis not present

## 2021-01-23 ENCOUNTER — Other Ambulatory Visit (HOSPITAL_COMMUNITY): Payer: Self-pay

## 2021-01-23 DIAGNOSIS — R1310 Dysphagia, unspecified: Secondary | ICD-10-CM

## 2021-01-31 DIAGNOSIS — H04123 Dry eye syndrome of bilateral lacrimal glands: Secondary | ICD-10-CM | POA: Diagnosis not present

## 2021-01-31 DIAGNOSIS — H16223 Keratoconjunctivitis sicca, not specified as Sjogren's, bilateral: Secondary | ICD-10-CM | POA: Diagnosis not present

## 2021-02-01 ENCOUNTER — Ambulatory Visit (HOSPITAL_COMMUNITY)
Admission: RE | Admit: 2021-02-01 | Discharge: 2021-02-01 | Disposition: A | Discharge status: Home / Self Care | Payer: PPO | Source: Ambulatory Visit | Attending: Internal Medicine | Admitting: Internal Medicine

## 2021-02-01 ENCOUNTER — Other Ambulatory Visit: Payer: Self-pay

## 2021-02-01 DIAGNOSIS — R1310 Dysphagia, unspecified: Secondary | ICD-10-CM | POA: Insufficient documentation

## 2021-02-14 DIAGNOSIS — R7301 Impaired fasting glucose: Secondary | ICD-10-CM | POA: Diagnosis not present

## 2021-02-14 DIAGNOSIS — E559 Vitamin D deficiency, unspecified: Secondary | ICD-10-CM | POA: Diagnosis not present

## 2021-02-14 DIAGNOSIS — F330 Major depressive disorder, recurrent, mild: Secondary | ICD-10-CM | POA: Diagnosis not present

## 2021-02-14 DIAGNOSIS — I10 Essential (primary) hypertension: Secondary | ICD-10-CM | POA: Diagnosis not present

## 2021-02-14 DIAGNOSIS — E785 Hyperlipidemia, unspecified: Secondary | ICD-10-CM | POA: Diagnosis not present

## 2021-02-14 DIAGNOSIS — R82998 Other abnormal findings in urine: Secondary | ICD-10-CM | POA: Diagnosis not present

## 2021-02-17 DIAGNOSIS — K760 Fatty (change of) liver, not elsewhere classified: Secondary | ICD-10-CM | POA: Diagnosis not present

## 2021-02-17 DIAGNOSIS — Z0000 Encounter for general adult medical examination without abnormal findings: Secondary | ICD-10-CM | POA: Diagnosis not present

## 2021-02-17 DIAGNOSIS — R7301 Impaired fasting glucose: Secondary | ICD-10-CM | POA: Diagnosis not present

## 2021-02-17 DIAGNOSIS — J392 Other diseases of pharynx: Secondary | ICD-10-CM | POA: Diagnosis not present

## 2021-02-17 DIAGNOSIS — J300 Vasomotor rhinitis: Secondary | ICD-10-CM | POA: Diagnosis not present

## 2021-02-17 DIAGNOSIS — Z23 Encounter for immunization: Secondary | ICD-10-CM | POA: Diagnosis not present

## 2021-02-17 DIAGNOSIS — I10 Essential (primary) hypertension: Secondary | ICD-10-CM | POA: Diagnosis not present

## 2021-02-17 DIAGNOSIS — E785 Hyperlipidemia, unspecified: Secondary | ICD-10-CM | POA: Diagnosis not present

## 2021-02-17 DIAGNOSIS — N329 Bladder disorder, unspecified: Secondary | ICD-10-CM | POA: Diagnosis not present

## 2021-02-17 DIAGNOSIS — Z1212 Encounter for screening for malignant neoplasm of rectum: Secondary | ICD-10-CM | POA: Diagnosis not present

## 2021-02-17 DIAGNOSIS — F329 Major depressive disorder, single episode, unspecified: Secondary | ICD-10-CM | POA: Diagnosis not present

## 2021-02-17 DIAGNOSIS — R0989 Other specified symptoms and signs involving the circulatory and respiratory systems: Secondary | ICD-10-CM | POA: Diagnosis not present

## 2021-02-20 ENCOUNTER — Other Ambulatory Visit: Payer: Self-pay | Admitting: Internal Medicine

## 2021-02-20 DIAGNOSIS — E785 Hyperlipidemia, unspecified: Secondary | ICD-10-CM

## 2021-03-07 ENCOUNTER — Ambulatory Visit
Admission: RE | Admit: 2021-03-07 | Discharge: 2021-03-07 | Disposition: A | Discharge status: Home / Self Care | Payer: No Typology Code available for payment source | Source: Ambulatory Visit | Attending: Internal Medicine | Admitting: Internal Medicine

## 2021-03-07 DIAGNOSIS — E785 Hyperlipidemia, unspecified: Secondary | ICD-10-CM

## 2021-03-16 DIAGNOSIS — N210 Calculus in bladder: Secondary | ICD-10-CM | POA: Diagnosis not present

## 2021-04-13 DIAGNOSIS — R3129 Other microscopic hematuria: Secondary | ICD-10-CM | POA: Diagnosis not present

## 2021-04-13 DIAGNOSIS — N210 Calculus in bladder: Secondary | ICD-10-CM | POA: Diagnosis not present

## 2021-04-20 ENCOUNTER — Ambulatory Visit: Payer: PPO | Admitting: Neurology

## 2021-04-27 DIAGNOSIS — N210 Calculus in bladder: Secondary | ICD-10-CM | POA: Diagnosis not present

## 2021-04-27 DIAGNOSIS — R3129 Other microscopic hematuria: Secondary | ICD-10-CM | POA: Diagnosis not present

## 2021-04-27 DIAGNOSIS — N489 Disorder of penis, unspecified: Secondary | ICD-10-CM | POA: Diagnosis not present

## 2021-05-19 DIAGNOSIS — F330 Major depressive disorder, recurrent, mild: Secondary | ICD-10-CM | POA: Diagnosis not present

## 2021-05-24 DIAGNOSIS — F330 Major depressive disorder, recurrent, mild: Secondary | ICD-10-CM | POA: Diagnosis not present

## 2021-06-07 DIAGNOSIS — F330 Major depressive disorder, recurrent, mild: Secondary | ICD-10-CM | POA: Diagnosis not present

## 2021-07-03 DIAGNOSIS — H16223 Keratoconjunctivitis sicca, not specified as Sjogren's, bilateral: Secondary | ICD-10-CM | POA: Diagnosis not present

## 2021-07-03 DIAGNOSIS — H04123 Dry eye syndrome of bilateral lacrimal glands: Secondary | ICD-10-CM | POA: Diagnosis not present

## 2021-07-20 DIAGNOSIS — Z20822 Contact with and (suspected) exposure to covid-19: Secondary | ICD-10-CM | POA: Diagnosis not present

## 2021-08-31 DIAGNOSIS — F329 Major depressive disorder, single episode, unspecified: Secondary | ICD-10-CM | POA: Diagnosis not present

## 2021-08-31 DIAGNOSIS — M5431 Sciatica, right side: Secondary | ICD-10-CM | POA: Diagnosis not present

## 2021-08-31 DIAGNOSIS — I2510 Atherosclerotic heart disease of native coronary artery without angina pectoris: Secondary | ICD-10-CM | POA: Diagnosis not present

## 2021-08-31 DIAGNOSIS — I10 Essential (primary) hypertension: Secondary | ICD-10-CM | POA: Diagnosis not present

## 2021-08-31 DIAGNOSIS — E785 Hyperlipidemia, unspecified: Secondary | ICD-10-CM | POA: Diagnosis not present

## 2021-08-31 DIAGNOSIS — K219 Gastro-esophageal reflux disease without esophagitis: Secondary | ICD-10-CM | POA: Diagnosis not present

## 2021-08-31 DIAGNOSIS — N329 Bladder disorder, unspecified: Secondary | ICD-10-CM | POA: Diagnosis not present

## 2021-08-31 DIAGNOSIS — R7301 Impaired fasting glucose: Secondary | ICD-10-CM | POA: Diagnosis not present

## 2021-08-31 DIAGNOSIS — M7630 Iliotibial band syndrome, unspecified leg: Secondary | ICD-10-CM | POA: Diagnosis not present

## 2021-09-01 ENCOUNTER — Other Ambulatory Visit: Payer: Self-pay | Admitting: Internal Medicine

## 2021-09-01 DIAGNOSIS — M5431 Sciatica, right side: Secondary | ICD-10-CM

## 2021-09-05 DIAGNOSIS — H16223 Keratoconjunctivitis sicca, not specified as Sjogren's, bilateral: Secondary | ICD-10-CM | POA: Diagnosis not present

## 2021-09-05 DIAGNOSIS — H04123 Dry eye syndrome of bilateral lacrimal glands: Secondary | ICD-10-CM | POA: Diagnosis not present

## 2021-09-08 ENCOUNTER — Ambulatory Visit
Admission: RE | Admit: 2021-09-08 | Discharge: 2021-09-08 | Disposition: A | Discharge status: Home / Self Care | Payer: PPO | Source: Ambulatory Visit | Attending: Internal Medicine | Admitting: Internal Medicine

## 2021-09-08 ENCOUNTER — Other Ambulatory Visit: Payer: Self-pay

## 2021-09-08 DIAGNOSIS — M5431 Sciatica, right side: Secondary | ICD-10-CM

## 2021-09-08 DIAGNOSIS — M545 Low back pain, unspecified: Secondary | ICD-10-CM | POA: Diagnosis not present

## 2021-09-11 DIAGNOSIS — M545 Low back pain, unspecified: Secondary | ICD-10-CM | POA: Diagnosis not present

## 2021-09-25 DIAGNOSIS — M545 Low back pain, unspecified: Secondary | ICD-10-CM | POA: Diagnosis not present

## 2021-10-16 DIAGNOSIS — M545 Low back pain, unspecified: Secondary | ICD-10-CM | POA: Diagnosis not present

## 2021-10-23 DIAGNOSIS — M5416 Radiculopathy, lumbar region: Secondary | ICD-10-CM | POA: Diagnosis not present

## 2021-10-30 DIAGNOSIS — F330 Major depressive disorder, recurrent, mild: Secondary | ICD-10-CM | POA: Diagnosis not present

## 2021-11-07 DIAGNOSIS — M5416 Radiculopathy, lumbar region: Secondary | ICD-10-CM | POA: Diagnosis not present

## 2021-11-18 DIAGNOSIS — M5416 Radiculopathy, lumbar region: Secondary | ICD-10-CM | POA: Diagnosis not present

## 2021-11-22 DIAGNOSIS — M5416 Radiculopathy, lumbar region: Secondary | ICD-10-CM | POA: Diagnosis not present

## 2021-12-07 DIAGNOSIS — M5416 Radiculopathy, lumbar region: Secondary | ICD-10-CM | POA: Diagnosis not present

## 2022-02-10 DIAGNOSIS — Z79891 Long term (current) use of opiate analgesic: Secondary | ICD-10-CM | POA: Diagnosis not present

## 2022-02-16 IMAGING — RF DG SWALLOWING FUNCTION
7 series · 19 of 24 positions shown · non-contrast
Comparison: Esophagram 12/14/2020.

CLINICAL DATA: Dysphagia, unspecified type.

EXAM:
MODIFIED BARIUM SWALLOW
TECHNIQUE: Different consistencies of barium were administered orally to the
patient by the Speech Pathologist. Imaging of the pharynx was
performed in the lateral projection. The radiologist was present in
the fluoroscopy room for this study, providing personal supervision.
FLUOROSCOPY TIME:  Fluoroscopy Time:  1 minutes, 12 seconds.
Radiation Exposure Index (if provided by the fluoroscopic device):
3.9 mGy
Number of Acquired Spot Images: None

[Series 1: cp_standard · 0.34mm/px · 2 of 128 frames shown (1 of 7)]
[frame 20/128]
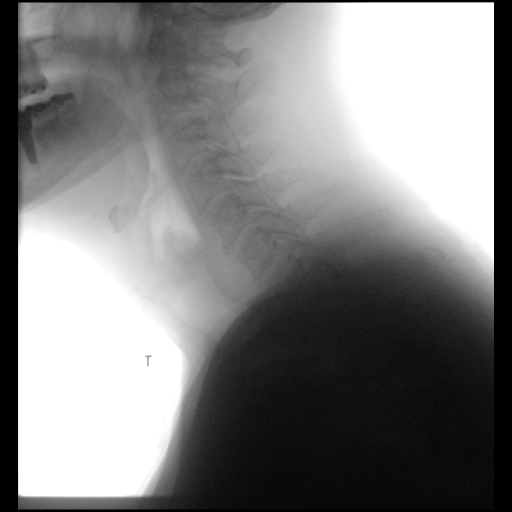
[frame 37/128]
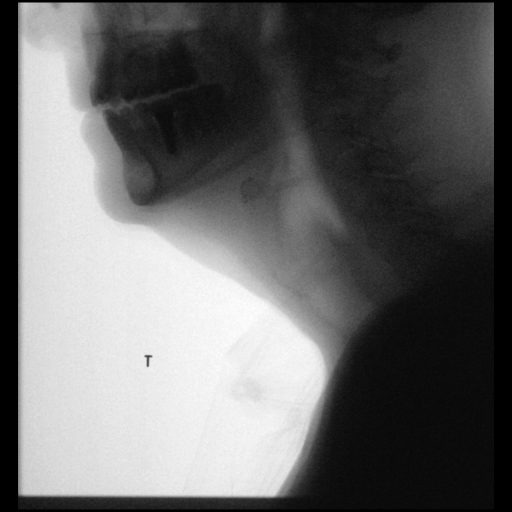

[Series 2: cp_standard · 0.34mm/px · 4 of 101 frames shown (2 of 7)]
[frame 16/101]
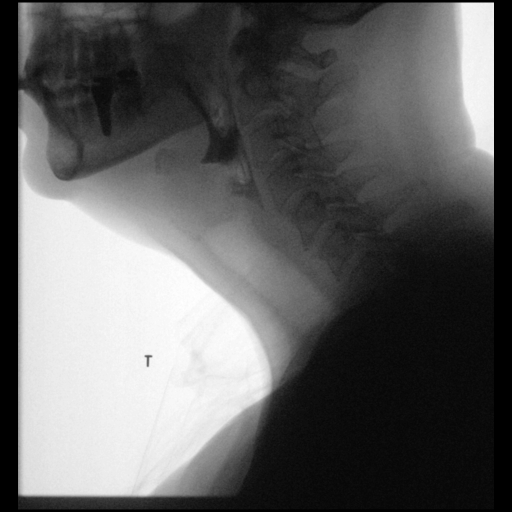
[frame 42/101]
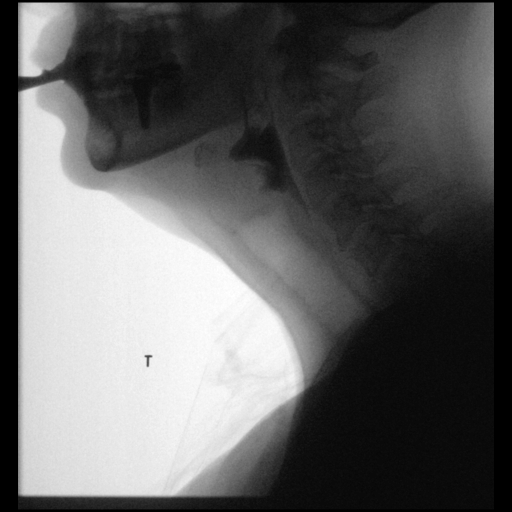
[frame 51/101]
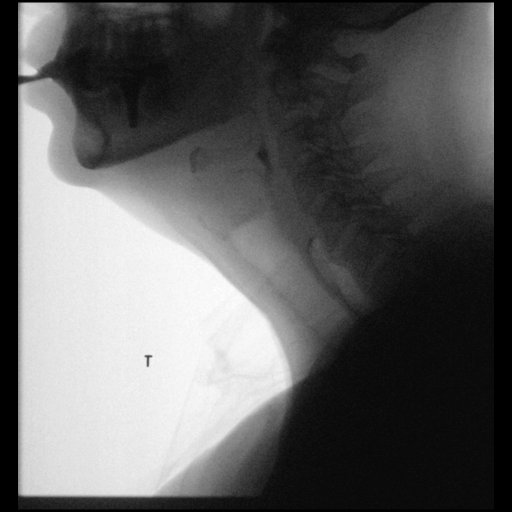
[frame 86/101]
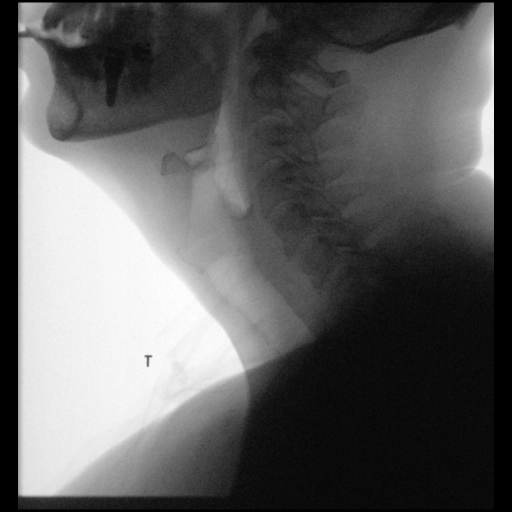

[Series 3: cp_standard · 0.34mm/px · 2 of 186 frames shown (3 of 7)]
[frame 47/186]
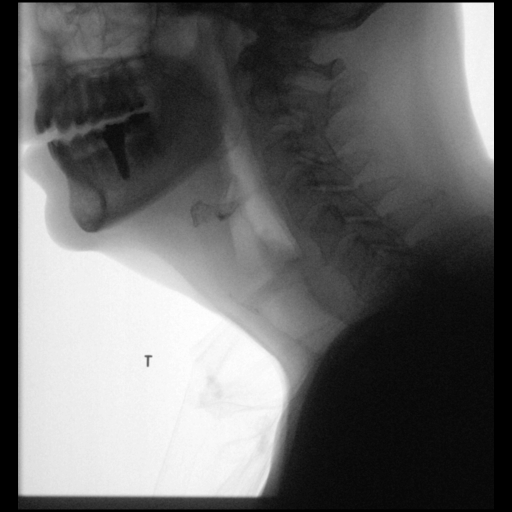
[frame 159/186]
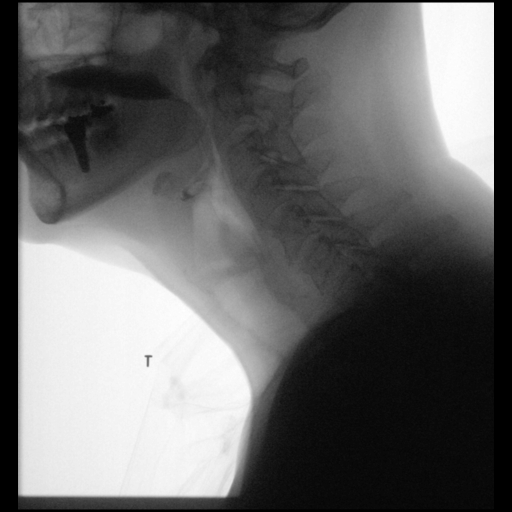

[Series 4: cp_standard · 0.34mm/px · 3 of 157 frames shown (4 of 7)]
[frame 24/157]
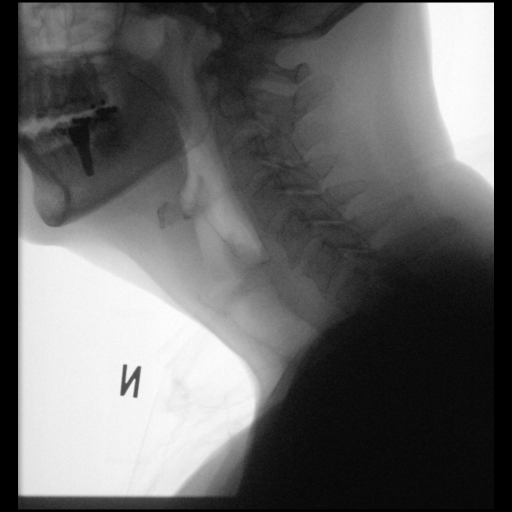
[frame 134/157]
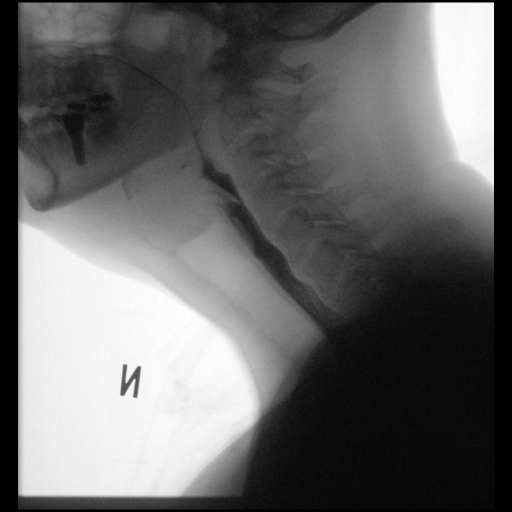
[frame 145/157]
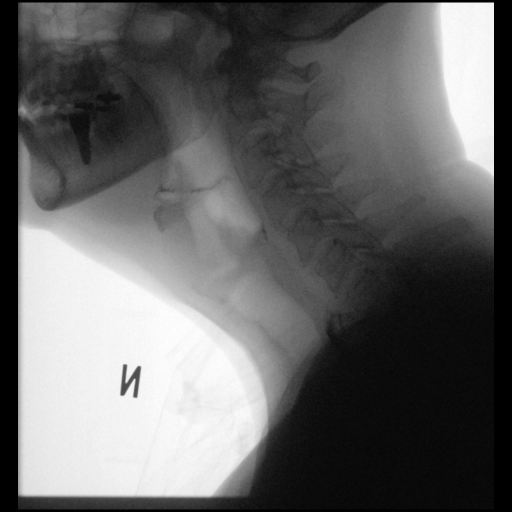

[Series 5: cp_standard · 0.34mm/px · 2 of 59 frames shown (5 of 7)]
[frame 9/59]
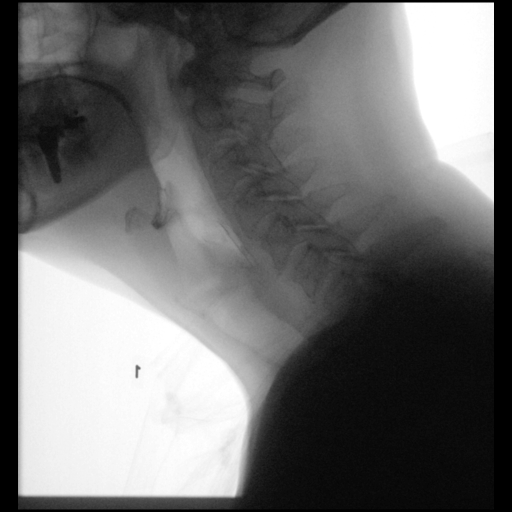
[frame 49/59]
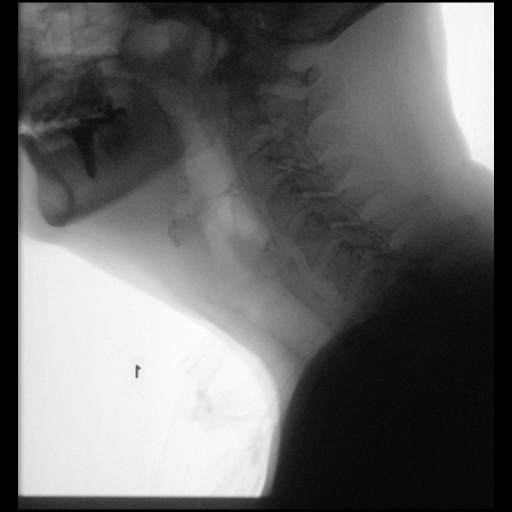

[Series 6: cp_standard · 0.34mm/px · 4 of 47 frames shown (6 of 7)]
[frame 2/47]
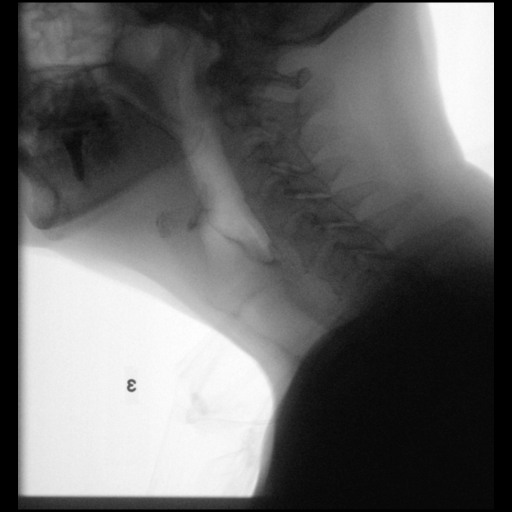
[frame 8/47]
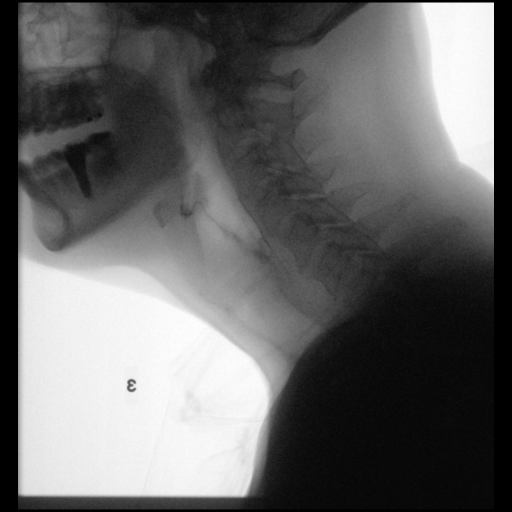
[frame 24/47]
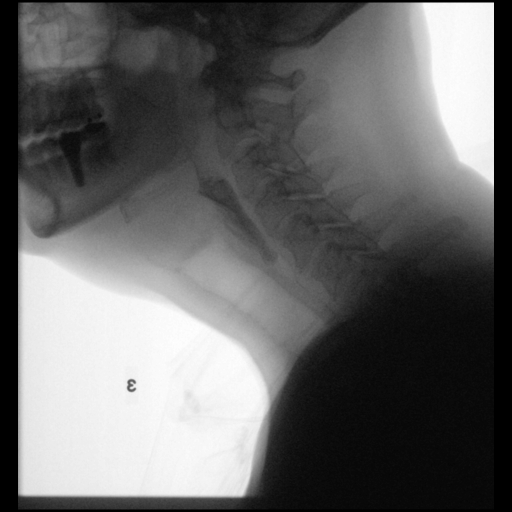
[frame 40/47]
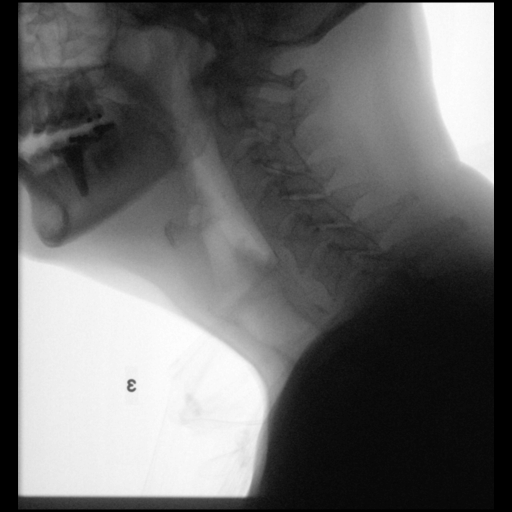

[Series 7: cp_standard · 0.34mm/px · 2 of 208 frames shown (7 of 7)]
[frame 146/208]
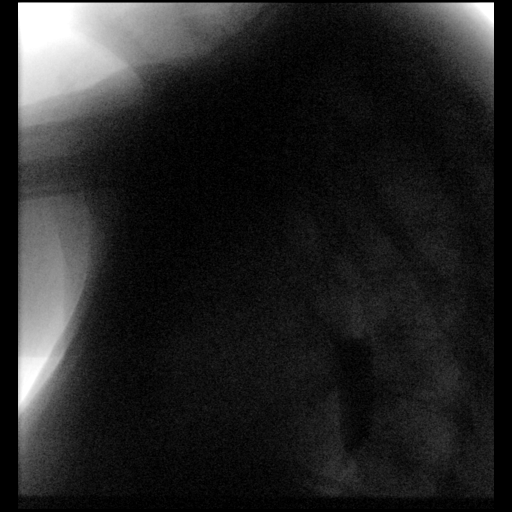
[frame 177/208]
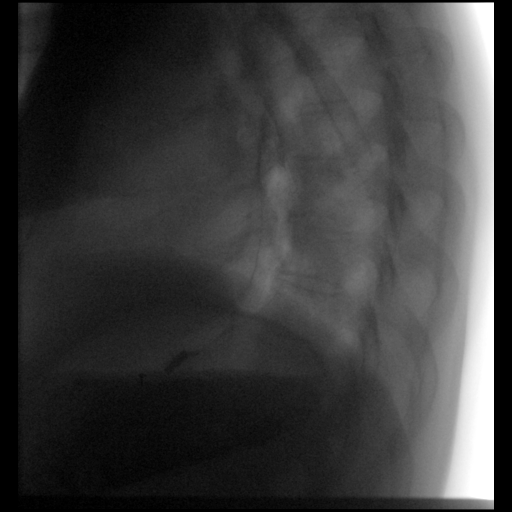

[19 of 24 positions shown; findings below may reference images not displayed]

FINDINGS: Different consistencies of barium were administered orally to the
patient by the Speech Pathologist with fluoroscopic imaging of the
pharynx, as well as limited imaging of the esophagus, from a lateral
projection. The radiologist was present in the fluoroscopy room for
this study, providing personal supervision. The Speech Pathologist
did not observe aspiration. Please refer to the Speech Pathologist's
report for full details.
IMPRESSION: Modified barium swallow, as described. No aspiration was observed.

Please refer to the Speech Pathologists report for complete details
and recommendations.

## 2022-03-20 DIAGNOSIS — E785 Hyperlipidemia, unspecified: Secondary | ICD-10-CM | POA: Diagnosis not present

## 2022-03-20 DIAGNOSIS — I10 Essential (primary) hypertension: Secondary | ICD-10-CM | POA: Diagnosis not present

## 2022-03-20 DIAGNOSIS — Z125 Encounter for screening for malignant neoplasm of prostate: Secondary | ICD-10-CM | POA: Diagnosis not present

## 2022-03-20 DIAGNOSIS — E559 Vitamin D deficiency, unspecified: Secondary | ICD-10-CM | POA: Diagnosis not present

## 2022-03-27 DIAGNOSIS — J300 Vasomotor rhinitis: Secondary | ICD-10-CM | POA: Diagnosis not present

## 2022-03-27 DIAGNOSIS — M5431 Sciatica, right side: Secondary | ICD-10-CM | POA: Diagnosis not present

## 2022-03-27 DIAGNOSIS — Z1331 Encounter for screening for depression: Secondary | ICD-10-CM | POA: Diagnosis not present

## 2022-03-27 DIAGNOSIS — I10 Essential (primary) hypertension: Secondary | ICD-10-CM | POA: Diagnosis not present

## 2022-03-27 DIAGNOSIS — I700 Atherosclerosis of aorta: Secondary | ICD-10-CM | POA: Diagnosis not present

## 2022-03-27 DIAGNOSIS — R7301 Impaired fasting glucose: Secondary | ICD-10-CM | POA: Diagnosis not present

## 2022-03-27 DIAGNOSIS — R82998 Other abnormal findings in urine: Secondary | ICD-10-CM | POA: Diagnosis not present

## 2022-03-27 DIAGNOSIS — K760 Fatty (change of) liver, not elsewhere classified: Secondary | ICD-10-CM | POA: Diagnosis not present

## 2022-03-27 DIAGNOSIS — I2510 Atherosclerotic heart disease of native coronary artery without angina pectoris: Secondary | ICD-10-CM | POA: Diagnosis not present

## 2022-03-27 DIAGNOSIS — E559 Vitamin D deficiency, unspecified: Secondary | ICD-10-CM | POA: Diagnosis not present

## 2022-03-27 DIAGNOSIS — Z0000 Encounter for general adult medical examination without abnormal findings: Secondary | ICD-10-CM | POA: Diagnosis not present

## 2022-03-27 DIAGNOSIS — Z1339 Encounter for screening examination for other mental health and behavioral disorders: Secondary | ICD-10-CM | POA: Diagnosis not present

## 2022-03-27 DIAGNOSIS — J984 Other disorders of lung: Secondary | ICD-10-CM | POA: Diagnosis not present

## 2022-07-06 DIAGNOSIS — F3342 Major depressive disorder, recurrent, in full remission: Secondary | ICD-10-CM | POA: Diagnosis not present

## 2022-07-06 DIAGNOSIS — F411 Generalized anxiety disorder: Secondary | ICD-10-CM | POA: Diagnosis not present

## 2022-07-13 DIAGNOSIS — L719 Rosacea, unspecified: Secondary | ICD-10-CM | POA: Diagnosis not present

## 2022-07-13 DIAGNOSIS — L570 Actinic keratosis: Secondary | ICD-10-CM | POA: Diagnosis not present

## 2022-07-13 DIAGNOSIS — L821 Other seborrheic keratosis: Secondary | ICD-10-CM | POA: Diagnosis not present

## 2022-07-13 DIAGNOSIS — D229 Melanocytic nevi, unspecified: Secondary | ICD-10-CM | POA: Diagnosis not present

## 2022-08-16 DIAGNOSIS — F3342 Major depressive disorder, recurrent, in full remission: Secondary | ICD-10-CM | POA: Diagnosis not present

## 2022-08-16 DIAGNOSIS — F411 Generalized anxiety disorder: Secondary | ICD-10-CM | POA: Diagnosis not present

## 2022-08-17 DIAGNOSIS — Z125 Encounter for screening for malignant neoplasm of prostate: Secondary | ICD-10-CM | POA: Diagnosis not present

## 2022-08-17 DIAGNOSIS — R310 Gross hematuria: Secondary | ICD-10-CM | POA: Diagnosis not present

## 2022-08-17 DIAGNOSIS — Z87442 Personal history of urinary calculi: Secondary | ICD-10-CM | POA: Diagnosis not present

## 2022-08-17 DIAGNOSIS — R3121 Asymptomatic microscopic hematuria: Secondary | ICD-10-CM | POA: Diagnosis not present

## 2022-08-17 DIAGNOSIS — R300 Dysuria: Secondary | ICD-10-CM | POA: Diagnosis not present

## 2022-08-28 DIAGNOSIS — K5730 Diverticulosis of large intestine without perforation or abscess without bleeding: Secondary | ICD-10-CM | POA: Diagnosis not present

## 2022-08-28 DIAGNOSIS — N281 Cyst of kidney, acquired: Secondary | ICD-10-CM | POA: Diagnosis not present

## 2022-08-28 DIAGNOSIS — R3121 Asymptomatic microscopic hematuria: Secondary | ICD-10-CM | POA: Diagnosis not present

## 2022-08-30 DIAGNOSIS — N3000 Acute cystitis without hematuria: Secondary | ICD-10-CM | POA: Diagnosis not present

## 2022-08-30 DIAGNOSIS — R3121 Asymptomatic microscopic hematuria: Secondary | ICD-10-CM | POA: Diagnosis not present

## 2022-09-05 DIAGNOSIS — F411 Generalized anxiety disorder: Secondary | ICD-10-CM | POA: Diagnosis not present

## 2022-09-05 DIAGNOSIS — F3342 Major depressive disorder, recurrent, in full remission: Secondary | ICD-10-CM | POA: Diagnosis not present

## 2022-09-12 DIAGNOSIS — F411 Generalized anxiety disorder: Secondary | ICD-10-CM | POA: Diagnosis not present

## 2022-09-12 DIAGNOSIS — F3342 Major depressive disorder, recurrent, in full remission: Secondary | ICD-10-CM | POA: Diagnosis not present

## 2022-10-02 DIAGNOSIS — Z125 Encounter for screening for malignant neoplasm of prostate: Secondary | ICD-10-CM | POA: Diagnosis not present

## 2022-10-04 DIAGNOSIS — F411 Generalized anxiety disorder: Secondary | ICD-10-CM | POA: Diagnosis not present

## 2022-10-04 DIAGNOSIS — F3342 Major depressive disorder, recurrent, in full remission: Secondary | ICD-10-CM | POA: Diagnosis not present

## 2022-11-06 DIAGNOSIS — F3342 Major depressive disorder, recurrent, in full remission: Secondary | ICD-10-CM | POA: Diagnosis not present

## 2022-11-06 DIAGNOSIS — F411 Generalized anxiety disorder: Secondary | ICD-10-CM | POA: Diagnosis not present

## 2022-12-07 DIAGNOSIS — F3342 Major depressive disorder, recurrent, in full remission: Secondary | ICD-10-CM | POA: Diagnosis not present

## 2022-12-07 DIAGNOSIS — F411 Generalized anxiety disorder: Secondary | ICD-10-CM | POA: Diagnosis not present

## 2022-12-20 DIAGNOSIS — F411 Generalized anxiety disorder: Secondary | ICD-10-CM | POA: Diagnosis not present

## 2022-12-20 DIAGNOSIS — F3342 Major depressive disorder, recurrent, in full remission: Secondary | ICD-10-CM | POA: Diagnosis not present

## 2023-01-15 DIAGNOSIS — F411 Generalized anxiety disorder: Secondary | ICD-10-CM | POA: Diagnosis not present

## 2023-01-15 DIAGNOSIS — F3342 Major depressive disorder, recurrent, in full remission: Secondary | ICD-10-CM | POA: Diagnosis not present

## 2023-01-29 DIAGNOSIS — L719 Rosacea, unspecified: Secondary | ICD-10-CM | POA: Diagnosis not present

## 2023-01-29 DIAGNOSIS — D1801 Hemangioma of skin and subcutaneous tissue: Secondary | ICD-10-CM | POA: Diagnosis not present

## 2023-01-29 DIAGNOSIS — L814 Other melanin hyperpigmentation: Secondary | ICD-10-CM | POA: Diagnosis not present

## 2023-01-29 DIAGNOSIS — Q825 Congenital non-neoplastic nevus: Secondary | ICD-10-CM | POA: Diagnosis not present

## 2023-01-29 DIAGNOSIS — L821 Other seborrheic keratosis: Secondary | ICD-10-CM | POA: Diagnosis not present

## 2023-01-29 DIAGNOSIS — L578 Other skin changes due to chronic exposure to nonionizing radiation: Secondary | ICD-10-CM | POA: Diagnosis not present

## 2023-01-30 DIAGNOSIS — F411 Generalized anxiety disorder: Secondary | ICD-10-CM | POA: Diagnosis not present

## 2023-01-30 DIAGNOSIS — F3342 Major depressive disorder, recurrent, in full remission: Secondary | ICD-10-CM | POA: Diagnosis not present

## 2023-02-26 DIAGNOSIS — F3342 Major depressive disorder, recurrent, in full remission: Secondary | ICD-10-CM | POA: Diagnosis not present

## 2023-02-26 DIAGNOSIS — F411 Generalized anxiety disorder: Secondary | ICD-10-CM | POA: Diagnosis not present

## 2023-03-26 DIAGNOSIS — F411 Generalized anxiety disorder: Secondary | ICD-10-CM | POA: Diagnosis not present

## 2023-03-26 DIAGNOSIS — F3342 Major depressive disorder, recurrent, in full remission: Secondary | ICD-10-CM | POA: Diagnosis not present

## 2023-04-09 DIAGNOSIS — M5416 Radiculopathy, lumbar region: Secondary | ICD-10-CM | POA: Diagnosis not present

## 2023-04-16 DIAGNOSIS — F411 Generalized anxiety disorder: Secondary | ICD-10-CM | POA: Diagnosis not present

## 2023-04-16 DIAGNOSIS — F3342 Major depressive disorder, recurrent, in full remission: Secondary | ICD-10-CM | POA: Diagnosis not present

## 2023-04-18 DIAGNOSIS — R7301 Impaired fasting glucose: Secondary | ICD-10-CM | POA: Diagnosis not present

## 2023-04-18 DIAGNOSIS — E559 Vitamin D deficiency, unspecified: Secondary | ICD-10-CM | POA: Diagnosis not present

## 2023-04-18 DIAGNOSIS — Z125 Encounter for screening for malignant neoplasm of prostate: Secondary | ICD-10-CM | POA: Diagnosis not present

## 2023-04-18 DIAGNOSIS — E785 Hyperlipidemia, unspecified: Secondary | ICD-10-CM | POA: Diagnosis not present

## 2023-04-18 DIAGNOSIS — I10 Essential (primary) hypertension: Secondary | ICD-10-CM | POA: Diagnosis not present

## 2023-04-18 DIAGNOSIS — K219 Gastro-esophageal reflux disease without esophagitis: Secondary | ICD-10-CM | POA: Diagnosis not present

## 2023-04-30 DIAGNOSIS — Z0001 Encounter for general adult medical examination with abnormal findings: Secondary | ICD-10-CM | POA: Diagnosis not present

## 2023-04-30 DIAGNOSIS — E063 Autoimmune thyroiditis: Secondary | ICD-10-CM | POA: Diagnosis not present

## 2023-05-14 DIAGNOSIS — F411 Generalized anxiety disorder: Secondary | ICD-10-CM | POA: Diagnosis not present

## 2023-05-14 DIAGNOSIS — F3342 Major depressive disorder, recurrent, in full remission: Secondary | ICD-10-CM | POA: Diagnosis not present

## 2023-05-15 DIAGNOSIS — K5730 Diverticulosis of large intestine without perforation or abscess without bleeding: Secondary | ICD-10-CM | POA: Diagnosis not present

## 2023-05-15 DIAGNOSIS — K760 Fatty (change of) liver, not elsewhere classified: Secondary | ICD-10-CM | POA: Diagnosis not present

## 2023-05-15 DIAGNOSIS — I10 Essential (primary) hypertension: Secondary | ICD-10-CM | POA: Diagnosis not present

## 2023-05-15 DIAGNOSIS — R82998 Other abnormal findings in urine: Secondary | ICD-10-CM | POA: Diagnosis not present

## 2023-05-15 DIAGNOSIS — L821 Other seborrheic keratosis: Secondary | ICD-10-CM | POA: Diagnosis not present

## 2023-05-15 DIAGNOSIS — I700 Atherosclerosis of aorta: Secondary | ICD-10-CM | POA: Diagnosis not present

## 2023-05-15 DIAGNOSIS — I2510 Atherosclerotic heart disease of native coronary artery without angina pectoris: Secondary | ICD-10-CM | POA: Diagnosis not present

## 2023-05-15 DIAGNOSIS — Z6831 Body mass index (BMI) 31.0-31.9, adult: Secondary | ICD-10-CM | POA: Diagnosis not present

## 2023-05-15 DIAGNOSIS — Z1331 Encounter for screening for depression: Secondary | ICD-10-CM | POA: Diagnosis not present

## 2023-05-15 DIAGNOSIS — J300 Vasomotor rhinitis: Secondary | ICD-10-CM | POA: Diagnosis not present

## 2023-05-15 DIAGNOSIS — Z1339 Encounter for screening examination for other mental health and behavioral disorders: Secondary | ICD-10-CM | POA: Diagnosis not present

## 2023-05-15 DIAGNOSIS — Z0000 Encounter for general adult medical examination without abnormal findings: Secondary | ICD-10-CM | POA: Diagnosis not present

## 2023-05-15 DIAGNOSIS — E669 Obesity, unspecified: Secondary | ICD-10-CM | POA: Diagnosis not present

## 2023-07-05 DIAGNOSIS — Z8241 Family history of sudden cardiac death: Secondary | ICD-10-CM | POA: Diagnosis not present

## 2023-07-05 DIAGNOSIS — E7800 Pure hypercholesterolemia, unspecified: Secondary | ICD-10-CM | POA: Diagnosis not present

## 2023-09-26 DIAGNOSIS — R7303 Prediabetes: Secondary | ICD-10-CM | POA: Diagnosis not present

## 2023-09-26 DIAGNOSIS — E785 Hyperlipidemia, unspecified: Secondary | ICD-10-CM | POA: Diagnosis not present

## 2023-09-26 DIAGNOSIS — I10 Essential (primary) hypertension: Secondary | ICD-10-CM | POA: Diagnosis not present

## 2023-10-01 DIAGNOSIS — R7301 Impaired fasting glucose: Secondary | ICD-10-CM | POA: Diagnosis not present

## 2023-10-01 DIAGNOSIS — I10 Essential (primary) hypertension: Secondary | ICD-10-CM | POA: Diagnosis not present

## 2023-10-01 DIAGNOSIS — E785 Hyperlipidemia, unspecified: Secondary | ICD-10-CM | POA: Diagnosis not present

## 2023-10-07 DIAGNOSIS — H9190 Unspecified hearing loss, unspecified ear: Secondary | ICD-10-CM | POA: Diagnosis not present

## 2023-10-07 DIAGNOSIS — I10 Essential (primary) hypertension: Secondary | ICD-10-CM | POA: Diagnosis not present

## 2023-10-07 DIAGNOSIS — R7301 Impaired fasting glucose: Secondary | ICD-10-CM | POA: Diagnosis not present

## 2023-10-07 DIAGNOSIS — F329 Major depressive disorder, single episode, unspecified: Secondary | ICD-10-CM | POA: Diagnosis not present

## 2023-10-07 DIAGNOSIS — E669 Obesity, unspecified: Secondary | ICD-10-CM | POA: Diagnosis not present

## 2023-10-07 DIAGNOSIS — I700 Atherosclerosis of aorta: Secondary | ICD-10-CM | POA: Diagnosis not present

## 2023-10-07 DIAGNOSIS — I2510 Atherosclerotic heart disease of native coronary artery without angina pectoris: Secondary | ICD-10-CM | POA: Diagnosis not present

## 2023-10-07 DIAGNOSIS — K760 Fatty (change of) liver, not elsewhere classified: Secondary | ICD-10-CM | POA: Diagnosis not present

## 2023-10-07 DIAGNOSIS — E785 Hyperlipidemia, unspecified: Secondary | ICD-10-CM | POA: Diagnosis not present

## 2023-10-07 DIAGNOSIS — M5431 Sciatica, right side: Secondary | ICD-10-CM | POA: Diagnosis not present

## 2023-10-07 DIAGNOSIS — M7630 Iliotibial band syndrome, unspecified leg: Secondary | ICD-10-CM | POA: Diagnosis not present

## 2024-01-16 DIAGNOSIS — E785 Hyperlipidemia, unspecified: Secondary | ICD-10-CM | POA: Diagnosis not present

## 2024-01-16 DIAGNOSIS — I2510 Atherosclerotic heart disease of native coronary artery without angina pectoris: Secondary | ICD-10-CM | POA: Diagnosis not present

## 2024-01-16 DIAGNOSIS — I10 Essential (primary) hypertension: Secondary | ICD-10-CM | POA: Diagnosis not present

## 2024-01-30 DIAGNOSIS — L578 Other skin changes due to chronic exposure to nonionizing radiation: Secondary | ICD-10-CM | POA: Diagnosis not present

## 2024-01-30 DIAGNOSIS — L820 Inflamed seborrheic keratosis: Secondary | ICD-10-CM | POA: Diagnosis not present

## 2024-01-30 DIAGNOSIS — L821 Other seborrheic keratosis: Secondary | ICD-10-CM | POA: Diagnosis not present

## 2024-01-30 DIAGNOSIS — Q825 Congenital non-neoplastic nevus: Secondary | ICD-10-CM | POA: Diagnosis not present

## 2024-01-30 DIAGNOSIS — L814 Other melanin hyperpigmentation: Secondary | ICD-10-CM | POA: Diagnosis not present

## 2024-01-30 DIAGNOSIS — D1801 Hemangioma of skin and subcutaneous tissue: Secondary | ICD-10-CM | POA: Diagnosis not present

## 2024-01-30 DIAGNOSIS — L719 Rosacea, unspecified: Secondary | ICD-10-CM | POA: Diagnosis not present

## 2024-02-18 DIAGNOSIS — K5730 Diverticulosis of large intestine without perforation or abscess without bleeding: Secondary | ICD-10-CM | POA: Diagnosis not present

## 2024-02-18 DIAGNOSIS — H9190 Unspecified hearing loss, unspecified ear: Secondary | ICD-10-CM | POA: Diagnosis not present

## 2024-02-18 DIAGNOSIS — F329 Major depressive disorder, single episode, unspecified: Secondary | ICD-10-CM | POA: Diagnosis not present

## 2024-02-18 DIAGNOSIS — E785 Hyperlipidemia, unspecified: Secondary | ICD-10-CM | POA: Diagnosis not present

## 2024-02-18 DIAGNOSIS — R7301 Impaired fasting glucose: Secondary | ICD-10-CM | POA: Diagnosis not present

## 2024-02-18 DIAGNOSIS — E669 Obesity, unspecified: Secondary | ICD-10-CM | POA: Diagnosis not present

## 2024-02-18 DIAGNOSIS — M7630 Iliotibial band syndrome, unspecified leg: Secondary | ICD-10-CM | POA: Diagnosis not present

## 2024-02-18 DIAGNOSIS — I2510 Atherosclerotic heart disease of native coronary artery without angina pectoris: Secondary | ICD-10-CM | POA: Diagnosis not present

## 2024-02-18 DIAGNOSIS — M5431 Sciatica, right side: Secondary | ICD-10-CM | POA: Diagnosis not present

## 2024-02-18 DIAGNOSIS — K760 Fatty (change of) liver, not elsewhere classified: Secondary | ICD-10-CM | POA: Diagnosis not present

## 2024-02-18 DIAGNOSIS — I10 Essential (primary) hypertension: Secondary | ICD-10-CM | POA: Diagnosis not present

## 2024-02-18 DIAGNOSIS — I700 Atherosclerosis of aorta: Secondary | ICD-10-CM | POA: Diagnosis not present

## 2024-08-04 ENCOUNTER — Other Ambulatory Visit: Payer: Self-pay | Admitting: Medical Genetics

## 2024-08-05 DIAGNOSIS — Z0001 Encounter for general adult medical examination with abnormal findings: Secondary | ICD-10-CM | POA: Diagnosis not present

## 2024-08-05 DIAGNOSIS — E063 Autoimmune thyroiditis: Secondary | ICD-10-CM | POA: Diagnosis not present

## 2024-08-05 DIAGNOSIS — Z79890 Hormone replacement therapy (postmenopausal): Secondary | ICD-10-CM | POA: Diagnosis not present

## 2024-08-05 DIAGNOSIS — Z8249 Family history of ischemic heart disease and other diseases of the circulatory system: Secondary | ICD-10-CM | POA: Diagnosis not present

## 2024-08-07 DIAGNOSIS — R82998 Other abnormal findings in urine: Secondary | ICD-10-CM | POA: Diagnosis not present

## 2024-08-07 DIAGNOSIS — Z1212 Encounter for screening for malignant neoplasm of rectum: Secondary | ICD-10-CM | POA: Diagnosis not present

## 2024-08-07 DIAGNOSIS — I10 Essential (primary) hypertension: Secondary | ICD-10-CM | POA: Diagnosis not present

## 2024-08-14 DIAGNOSIS — Z6831 Body mass index (BMI) 31.0-31.9, adult: Secondary | ICD-10-CM | POA: Diagnosis not present

## 2024-08-14 DIAGNOSIS — I2510 Atherosclerotic heart disease of native coronary artery without angina pectoris: Secondary | ICD-10-CM | POA: Diagnosis not present

## 2024-08-14 DIAGNOSIS — K760 Fatty (change of) liver, not elsewhere classified: Secondary | ICD-10-CM | POA: Diagnosis not present

## 2024-08-14 DIAGNOSIS — R7982 Elevated C-reactive protein (CRP): Secondary | ICD-10-CM | POA: Diagnosis not present

## 2024-08-14 DIAGNOSIS — Z0000 Encounter for general adult medical examination without abnormal findings: Secondary | ICD-10-CM | POA: Diagnosis not present

## 2024-08-14 DIAGNOSIS — H04129 Dry eye syndrome of unspecified lacrimal gland: Secondary | ICD-10-CM | POA: Diagnosis not present

## 2024-08-14 DIAGNOSIS — I700 Atherosclerosis of aorta: Secondary | ICD-10-CM | POA: Diagnosis not present

## 2024-08-14 DIAGNOSIS — E785 Hyperlipidemia, unspecified: Secondary | ICD-10-CM | POA: Diagnosis not present

## 2024-08-14 DIAGNOSIS — I10 Essential (primary) hypertension: Secondary | ICD-10-CM | POA: Diagnosis not present

## 2024-08-14 DIAGNOSIS — E669 Obesity, unspecified: Secondary | ICD-10-CM | POA: Diagnosis not present

## 2024-08-14 DIAGNOSIS — F329 Major depressive disorder, single episode, unspecified: Secondary | ICD-10-CM | POA: Diagnosis not present

## 2024-10-08 DIAGNOSIS — B349 Viral infection, unspecified: Secondary | ICD-10-CM | POA: Diagnosis not present

## 2024-10-08 DIAGNOSIS — I10 Essential (primary) hypertension: Secondary | ICD-10-CM | POA: Diagnosis not present

## 2024-10-08 DIAGNOSIS — R051 Acute cough: Secondary | ICD-10-CM | POA: Diagnosis not present

## 2024-10-08 DIAGNOSIS — J029 Acute pharyngitis, unspecified: Secondary | ICD-10-CM | POA: Diagnosis not present

## 2024-10-15 ENCOUNTER — Other Ambulatory Visit: Payer: Self-pay | Admitting: Medical Genetics

## 2024-10-15 DIAGNOSIS — Z006 Encounter for examination for normal comparison and control in clinical research program: Secondary | ICD-10-CM

## 2024-11-09 LAB — GENECONNECT MOLECULAR SCREEN: Genetic Analysis Overall Interpretation: NEGATIVE

## 2024-11-23 ENCOUNTER — Ambulatory Visit: Admitting: Orthopaedic Surgery

## 2024-11-23 ENCOUNTER — Encounter: Payer: Self-pay | Admitting: Orthopaedic Surgery

## 2024-11-23 DIAGNOSIS — M7632 Iliotibial band syndrome, left leg: Secondary | ICD-10-CM

## 2024-11-23 NOTE — Progress Notes (Signed)
 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  He comes in today with about 6 months worth of left thigh pain but he points to the IT band as a source of his thigh pain.  He says there is no numbness and tingling but has become painful with time on that side.  He said he had gained a significant mount of weight over the years but now has lost about 70 pounds and that is help with his back.  He is not a diabetic.  He is a patient of Dr. Oneil Neth as well.  He would prefer not to take anti-inflammatories.  He does not describe any recent injury either.  On examination, his left hip and left knee moves smoothly and fluidly.  His pain is quite significant over the mid IT band between the knee and the hip.  I did place a diagnostic and hopefully therapeutic steroid injection in this area.  After 5 minutes his pain subsided significantly from just the lidocaine .  I think this is more of significant inflammation of the IT band.  I have recommended Voltaren gel 2-3 times daily and even considering Salonpas lidocaine  patches.  We can always repeat an injection in 8 to 12 weeks in the same area if needed.  He felt much better overall.  All questions and concerns were answered and addressed.

## 2025-02-22 ENCOUNTER — Ambulatory Visit: Admitting: Orthopaedic Surgery
